# Patient Record
Sex: Male | Born: 1955 | Race: Black or African American | Hispanic: No | Marital: Married | Smoking: Never smoker
Health system: Southern US, Community
[De-identification: ages and names within clinical notes are randomized; demographics above are authoritative.]

## PROBLEM LIST (undated history)

## (undated) DIAGNOSIS — E785 Hyperlipidemia, unspecified: Secondary | ICD-10-CM

## (undated) DIAGNOSIS — I10 Essential (primary) hypertension: Secondary | ICD-10-CM

## (undated) DIAGNOSIS — E119 Type 2 diabetes mellitus without complications: Secondary | ICD-10-CM

## (undated) DIAGNOSIS — R7303 Prediabetes: Secondary | ICD-10-CM

## (undated) DIAGNOSIS — C61 Malignant neoplasm of prostate: Secondary | ICD-10-CM

## (undated) DIAGNOSIS — N529 Male erectile dysfunction, unspecified: Secondary | ICD-10-CM

## (undated) HISTORY — DX: Male erectile dysfunction, unspecified: N52.9

## (undated) HISTORY — PX: APPENDECTOMY: SHX54

## (undated) HISTORY — DX: Malignant neoplasm of prostate: C61

## (undated) HISTORY — PX: FRACTURE SURGERY: SHX138

---

## 2006-04-25 ENCOUNTER — Emergency Department: Payer: Self-pay

## 2006-04-25 ENCOUNTER — Other Ambulatory Visit: Payer: Self-pay

## 2006-10-31 ENCOUNTER — Other Ambulatory Visit: Payer: Self-pay

## 2006-10-31 ENCOUNTER — Inpatient Hospital Stay: Payer: Self-pay | Admitting: General Surgery

## 2009-06-29 HISTORY — PX: COLONOSCOPY: SHX174

## 2009-08-05 ENCOUNTER — Ambulatory Visit: Payer: Self-pay | Admitting: Gastroenterology

## 2015-11-27 ENCOUNTER — Emergency Department
Admission: EM | Admit: 2015-11-27 | Discharge: 2015-11-27 | Disposition: A | Discharge status: Home / Self Care | Payer: Federal, State, Local not specified - PPO | Attending: Emergency Medicine | Admitting: Emergency Medicine

## 2015-11-27 ENCOUNTER — Encounter: Payer: Self-pay | Admitting: Urgent Care

## 2015-11-27 DIAGNOSIS — W57XXXA Bitten or stung by nonvenomous insect and other nonvenomous arthropods, initial encounter: Secondary | ICD-10-CM | POA: Diagnosis not present

## 2015-11-27 DIAGNOSIS — S40261A Insect bite (nonvenomous) of right shoulder, initial encounter: Secondary | ICD-10-CM | POA: Insufficient documentation

## 2015-11-27 DIAGNOSIS — Y929 Unspecified place or not applicable: Secondary | ICD-10-CM | POA: Insufficient documentation

## 2015-11-27 DIAGNOSIS — S40861A Insect bite (nonvenomous) of right upper arm, initial encounter: Secondary | ICD-10-CM

## 2015-11-27 DIAGNOSIS — Y999 Unspecified external cause status: Secondary | ICD-10-CM | POA: Insufficient documentation

## 2015-11-27 DIAGNOSIS — Y939 Activity, unspecified: Secondary | ICD-10-CM | POA: Insufficient documentation

## 2015-11-27 NOTE — Discharge Instructions (Signed)
Tick Bite Information Ticks are insects that attach themselves to the skin and draw blood for food. There are various types of ticks. Common types include wood ticks and deer ticks. Most ticks live in shrubs and grassy areas. Ticks can climb onto your body when you make contact with leaves or grass where the tick is waiting. The most common places on the body for ticks to attach themselves are the scalp, neck, armpits, waist, and groin. Most tick bites are harmless, but sometimes ticks carry germs that cause diseases. These germs can be spread to a person during the tick's feeding process. The chance of a disease spreading through a tick bite depends on:   The type of tick.  Time of year.   How long the tick is attached.   Geographic location.  HOW CAN YOU PREVENT TICK BITES? Take these steps to help prevent tick bites when you are outdoors:  Wear protective clothing. Long sleeves and long pants are best.   Wear white clothes so you can see ticks more easily.  Tuck your pant legs into your socks.   If walking on a trail, stay in the middle of the trail to avoid brushing against bushes.  Avoid walking through areas with long grass.  Put insect repellent on all exposed skin and along boot tops, pant legs, and sleeve cuffs.   Check clothing, hair, and skin repeatedly and before going inside.   Brush off any ticks that are not attached.  Take a shower or bath as soon as possible after being outdoors.  WHAT IS THE PROPER WAY TO REMOVE A TICK? Ticks should be removed as soon as possible to help prevent diseases caused by tick bites. 1. If latex gloves are available, put them on before trying to remove a tick.  2. Using fine-point tweezers, grasp the tick as close to the skin as possible. You may also use curved forceps or a tick removal tool. Grasp the tick as close to its head as possible. Avoid grasping the tick on its body. 3. Pull gently with steady upward pressure until  the tick lets go. Do not twist the tick or jerk it suddenly. This may break off the tick's head or mouth parts. 4. Do not squeeze or crush the tick's body. This could force disease-carrying fluids from the tick into your body.  5. After the tick is removed, wash the bite area and your hands with soap and water or other disinfectant such as alcohol. 6. Apply a small amount of antiseptic cream or ointment to the bite site.  7. Wash and disinfect any instruments that were used.  Do not try to remove a tick by applying a hot match, petroleum jelly, or fingernail polish to the tick. These methods do not work and may increase the chances of disease being spread from the tick bite.  WHEN SHOULD YOU SEEK MEDICAL CARE? Contact your health care provider if you are unable to remove a tick from your skin or if a part of the tick breaks off and is stuck in the skin.  After a tick bite, you need to be aware of signs and symptoms that could be related to diseases spread by ticks. Contact your health care provider if you develop any of the following in the days or weeks after the tick bite:  Unexplained fever.  Rash. A circular rash that appears days or weeks after the tick bite may indicate the possibility of Lyme disease. The rash may resemble  a target with a bull's-eye and may occur at a different part of your body than the tick bite.  Redness and swelling in the area of the tick bite.   Tender, swollen lymph glands.   Diarrhea.   Weight loss.   Cough.   Fatigue.   Muscle, joint, or bone pain.   Abdominal pain.   Headache.   Lethargy or a change in your level of consciousness.  Difficulty walking or moving your legs.   Numbness in the legs.   Paralysis.  Shortness of breath.   Confusion.   Repeated vomiting.    This information is not intended to replace advice given to you by your health care provider. Make sure you discuss any questions you have with your health  care provider.   Document Released: 06/12/2000 Document Revised: 07/06/2014 Document Reviewed: 11/23/2012 Elsevier Interactive Patient Education 2016 [place] [facility] if any continued problems.

## 2015-11-27 NOTE — ED Notes (Signed)
Patient presents with tick embedded in his RIGHT posterior shoulder. Patient discovered tick just PTA when taking a shower.

## 2015-11-27 NOTE — ED Notes (Signed)
Pt has a tick on right shoulder that he noticed 45 minutes ago - Pt would like [place] to remove the tick

## 2015-11-27 NOTE — ED Notes (Signed)
Tick on right shoulder - pt noticed it 45 minutes ago and would like [place] to remove it for him

## 2015-11-27 NOTE — ED Provider Notes (Signed)
[facility] Emergency Department Provider Note  ____________________________________________  Time seen: Approximately 7:47 PM  I have reviewed the triage vital signs and the nursing notes.   HISTORY  Chief Complaint Tick Removal   HPI Edward Harmon is a 60 y.o. male is here to have a tick removed from his right shoulder that he noticed 45 minutes prior to his arrival in the emergency room.Patient denies any symptoms of headache or rash. He is uncertain when he got the tick but only noticed it today. He denies any previous tick exposures.   History reviewed. No pertinent past medical history.  There are no active problems to display for this patient.   History reviewed. No pertinent past surgical history.  No current outpatient prescriptions on file.  Allergies Review of patient's allergies indicates no known allergies.  No family history on file.  Social History Social History  Substance Use Topics  . Smoking status: Never Smoker   . Smokeless tobacco: None  . Alcohol Use: No    Review of Systems Constitutional: No fever/chills Cardiovascular: Denies chest pain. Respiratory: Denies shortness of breath. Gastrointestinal:  No nausea, no vomiting.   Musculoskeletal: Denies joint aches. Skin: Negative for rash. Neurological: Negative for headaches, focal weakness or numbness.  10-point ROS otherwise negative.  ____________________________________________   PHYSICAL EXAM:  VITAL SIGNS: ED Triage Vitals  Enc Vitals Group     BP 11/27/15 1931 170/84 mmHg     Pulse Rate 11/27/15 1931 85     Resp 11/27/15 1931 18     Temp 11/27/15 1931 98.4 F (36.9 C)     Temp Source 11/27/15 1931 Oral     SpO2 11/27/15 1931 97 %     Weight 11/27/15 1931 205 lb (92.987 kg)     Height 11/27/15 1931 5\' 9"  (1.753 m)     Head Cir --      Peak Flow --      Pain Score 11/27/15 1932 0     Pain Loc --      Pain Edu? --      Excl. in [place]? --      Constitutional: Alert and oriented. Well appearing and in no acute distress. Eyes: Conjunctivae are normal. PERRL. EOMI. Head: Atraumatic. Nose: No congestion/rhinnorhea. Neck: No stridor.   Cardiovascular: Normal rate, regular rhythm. Grossly normal heart sounds.  Good peripheral circulation. Respiratory: Normal respiratory effort.  No retractions. Lungs CTAB. Gastrointestinal: Soft and nontender. No distention.  Musculoskeletal: Moves upper and lower extremities without any difficulty. Normal gait was noted. Neurologic:  Normal speech and language. No gross focal neurologic deficits are appreciated. No gait instability. Skin:  Skin is warm, dry. There is a tick on the right posterior upper arm. No rashes were noted surrounding this area. Psychiatric: Mood and affect are normal. Speech and behavior are normal.  ____________________________________________   LABS (all labs ordered are listed, but only abnormal results are displayed)  Labs Reviewed - No data to display   PROCEDURES  Procedure(s) performed: Tick was removed using an alcohol swab and gentle traction. Patient tolerated procedure well. Tick was removed including the head without any difficulty.   Critical Care performed: No  ____________________________________________   INITIAL IMPRESSION / ASSESSMENT AND PLAN / ED COURSE  Pertinent labs & imaging results that were available during my care of the patient were reviewed by me and considered in my medical decision making (see chart for details).  Patient was advised to Circle this date on his  calendar and to watch for any signs of headache, fever, joint aches or rash. He is to follow-up with his primary care doctor if any continued problems.  ____________________________________________   FINAL CLINICAL IMPRESSION(S) / ED DIAGNOSES  Final diagnoses:  Tick bite of upper arm, right, initial encounter      NEW MEDICATIONS STARTED DURING THIS VISIT:  There  are no discharge medications for this patient.    Note:  This document was prepared using Dragon voice recognition software and may include unintentional dictation errors.    Johnn Hai, PA-C 11/27/15 2100  Daymon Larsen, MD 11/27/15 2221

## 2018-01-15 ENCOUNTER — Other Ambulatory Visit: Payer: Self-pay

## 2018-01-15 ENCOUNTER — Emergency Department
Admission: EM | Admit: 2018-01-15 | Discharge: 2018-01-15 | Disposition: A | Discharge status: Home / Self Care | Payer: Federal, State, Local not specified - PPO | Attending: Emergency Medicine | Admitting: Emergency Medicine

## 2018-01-15 ENCOUNTER — Encounter: Payer: Self-pay | Admitting: Emergency Medicine

## 2018-01-15 DIAGNOSIS — M5432 Sciatica, left side: Secondary | ICD-10-CM | POA: Insufficient documentation

## 2018-01-15 DIAGNOSIS — M545 Low back pain: Secondary | ICD-10-CM | POA: Diagnosis present

## 2018-01-15 DIAGNOSIS — I10 Essential (primary) hypertension: Secondary | ICD-10-CM | POA: Diagnosis not present

## 2018-01-15 HISTORY — DX: Essential (primary) hypertension: I10

## 2018-01-15 MED ORDER — KETOROLAC TROMETHAMINE 30 MG/ML IJ SOLN
30.0000 mg | Freq: Once | INTRAMUSCULAR | Status: AC
  Administered 2018-01-15: 30 mg via INTRAMUSCULAR
  Filled 2018-01-15: qty 1

## 2018-01-15 MED ORDER — PENTAFLUOROPROP-TETRAFLUOROETH EX AERO
INHALATION_SPRAY | CUTANEOUS | Status: AC
  Filled 2018-01-15: qty 30

## 2018-01-15 MED ORDER — ORPHENADRINE CITRATE 30 MG/ML IJ SOLN
60.0000 mg | Freq: Two times a day (BID) | INTRAMUSCULAR | Status: DC
  Administered 2018-01-15: 60 mg via INTRAMUSCULAR
  Filled 2018-01-15: qty 2

## 2018-01-15 MED ORDER — CYCLOBENZAPRINE HCL 5 MG PO TABS: ORAL_TABLET | ORAL | 0 refills | Status: DC

## 2018-01-15 MED ORDER — PENTAFLUOROPROP-TETRAFLUOROETH EX AERO
INHALATION_SPRAY | CUTANEOUS | Status: DC | PRN
  Administered 2018-01-15: 2 via TOPICAL

## 2018-01-15 MED ORDER — IBUPROFEN 800 MG PO TABS: 800.0000 mg | ORAL_TABLET | Freq: Three times a day (TID) | ORAL | 0 refills | Status: DC | PRN

## 2018-01-15 NOTE — ED Provider Notes (Signed)
[facility] Emergency Department Provider Note  ____________________________________________  Time seen: Approximately 2:11 PM  I have reviewed the triage vital signs and the nursing notes.   HISTORY  Chief Complaint Hip Pain    HPI Edward Harmon is a 62 y.o. male that presents emergency department for evaluation of left buttocks pain that radiates into left thigh for 1 day.  Patient had the same symptoms several years ago when he was diagnosed with sciatica.  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  No lower leg pain or edema. He took an aspirin for pain.  No IV drug use.  No bowel or bladder dysfunction or saddle paresthesias.  No trauma.  No fever, chills.   Past Medical History:  Diagnosis Date  . Hypertension     There are no active problems to display for this patient.   History reviewed. No pertinent surgical history.  Prior to Admission medications   Medication Sig Start Date End Date Taking? Authorizing Provider  cyclobenzaprine (FLEXERIL) 5 MG tablet Take 1-2 tablets 3 times daily as needed 01/15/18   Laban Emperor, PA-C  ibuprofen (ADVIL,MOTRIN) 800 MG tablet Take 1 tablet (800 mg total) by mouth every 8 (eight) hours as needed. 01/15/18   Laban Emperor, PA-C    Allergies Patient has no known allergies.  History reviewed. No pertinent family history.  Social History Social History   Tobacco Use  . Smoking status: Never Smoker  . Smokeless tobacco: Never Used  Substance Use Topics  . Alcohol use: No  . Drug use: Never     Review of Systems  Constitutional: No fever/chills Gastrointestinal: No abdominal pain.  No nausea, no vomiting.  Musculoskeletal: Negative for musculoskeletal pain. Skin: Negative for rash, abrasions, lacerations, ecchymosis. Neurological: Negative for headaches, numbness or tingling   ____________________________________________   PHYSICAL EXAM:  VITAL  SIGNS: ED Triage Vitals [01/15/18 1300]  Enc Vitals Group     BP (!) 188/79     Pulse Rate 87     Resp 16     Temp 98.2 F (36.8 C)     Temp Source Oral     SpO2 99 %     Weight 210 lb (95.3 kg)     Height      Head Circumference      Peak Flow      Pain Score 5     Pain Loc      Pain Edu?      Excl. in [place]?      Constitutional: Alert and oriented. Well appearing and in no acute distress. Eyes: Conjunctivae are normal. PERRL. EOMI. Head: Atraumatic. ENT:      Ears:      Nose: No congestion/rhinnorhea.      Mouth/Throat: Mucous membranes are moist.  Neck: No stridor.  Cardiovascular: Normal rate, regular rhythm.  Good peripheral circulation. Respiratory: Normal respiratory effort without tachypnea or retractions. Lungs CTAB. Good air entry to the bases with no decreased or absent breath sounds. Gastrointestinal: Bowel sounds 4 quadrants. Soft and nontender to palpation. No guarding or rigidity. No palpable masses. No distention.  Musculoskeletal: Full range of motion to all extremities. No gross deformities appreciated.  Tenderness to palpation over left SI joint.  No tenderness to palpation over lumbar spine.  No tenderness to palpation of the trochanteric bursa.  Full range of motion of hip.  Strength equal in lower extremities bilaterally.  Antalgic gait.  Neurologic:  Normal speech and language. No gross focal neurologic deficits are appreciated.  Skin:  Skin is warm, dry and intact. No rash noted. Psychiatric: Mood and affect are normal. Speech and behavior are normal. Patient exhibits appropriate insight and judgement.   ____________________________________________   LABS (all labs ordered are listed, but only abnormal results are displayed)  Labs Reviewed - No data to display ____________________________________________  EKG   ____________________________________________  RADIOLOGY   No results  found.  ____________________________________________    PROCEDURES  Procedure(s) performed:    Procedures    Medications  orphenadrine (NORFLEX) injection 60 mg (60 mg Intramuscular Given 01/15/18 1434)  pentafluoroprop-tetrafluoroeth (GEBAUERS) aerosol (2 application Topical Given 01/15/18 1433)  ketorolac (TORADOL) 30 MG/ML injection 30 mg (30 mg Intramuscular Given 01/15/18 1434)     ____________________________________________   INITIAL IMPRESSION / ASSESSMENT AND PLAN / ED COURSE  Pertinent labs & imaging results that were available during my care of the patient were reviewed by me and considered in my medical decision making (see chart for details).  Review of the [place] CSRS was performed in accordance of the [place] prior to dispensing any controlled drugs.   Patient's diagnosis is consistent with sciatica. Vital signs and exam are reassuring.  No bowel or bladder dysfunction saddle paresthesias.  No trauma.  Imaging is deferred at this time and patient is agreeable.  He was given IM Toradol and Norflex.  Patient will be discharged home with prescriptions for ibuprofen and Flexeril. Patient is to follow up with Ortho as directed. Patient is given ED precautions to return to the ED for any worsening or new symptoms.     ____________________________________________  FINAL CLINICAL IMPRESSION(S) / ED DIAGNOSES  Final diagnoses:  Sciatica of left side      NEW MEDICATIONS STARTED DURING THIS VISIT:  ED Discharge Orders        Ordered    ibuprofen (ADVIL,MOTRIN) 800 MG tablet  Every 8 hours PRN     01/15/18 1436    cyclobenzaprine (FLEXERIL) 5 MG tablet     01/15/18 1436          This chart was dictated using voice recognition software/Dragon. Despite best efforts to proofread, errors can occur which can change the meaning. Any change was purely unintentional.    Laban Emperor, PA-C 01/15/18 1604    Harmon, Edward An, MD 01/16/18 [id number]

## 2018-01-15 NOTE — ED Triage Notes (Signed)
Pt to ed with c/o left hip pain that radiates down left leg.  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

## 2019-04-11 ENCOUNTER — Encounter: Payer: Self-pay | Admitting: Urology

## 2019-04-11 ENCOUNTER — Other Ambulatory Visit
Admission: RE | Admit: 2019-04-11 | Discharge: 2019-04-11 | Disposition: A | Discharge status: Home / Self Care | Payer: Federal, State, Local not specified - PPO | Attending: Urology | Admitting: Urology

## 2019-04-11 ENCOUNTER — Ambulatory Visit (INDEPENDENT_AMBULATORY_CARE_PROVIDER_SITE_OTHER): Payer: Federal, State, Local not specified - PPO | Admitting: Urology

## 2019-04-11 ENCOUNTER — Other Ambulatory Visit: Payer: Self-pay

## 2019-04-11 VITALS — BP 145/86 | HR 95 | Ht 69.5 in | Wt 205.0 lb

## 2019-04-11 DIAGNOSIS — R972 Elevated prostate specific antigen [PSA]: Secondary | ICD-10-CM | POA: Insufficient documentation

## 2019-04-11 LAB — BLADDER SCAN AMB NON-IMAGING

## 2019-04-11 NOTE — Patient Instructions (Addendum)
Prostate Cancer Screening  The prostate is a walnut-sized gland that is located below the bladder and in front of the rectum in males. The function of the prostate (prostate gland) is to add fluid to semen during ejaculation. Prostate cancer is the second most common type of cancer in men. A screening test for cancer is a test that is done before cancer symptoms start. Screening can help to identify cancer at an early stage, when the cancer can be treated more easily. The recommended prostate cancer screening test is a blood test called the prostate-specific antigen (PSA) test. PSA is a protein that is made in the prostate. As you age, your prostate naturally produces more PSA. Abnormally high PSA levels may be caused by:  Prostate cancer.  An enlarged prostate that is not caused by cancer (benign prostatic hyperplasia, BPH). This condition is very common in older men.  A prostate gland infection (prostatitis).  Medicines to assist with hair growth, such as finasteride. Depending on the PSA results, you may need more tests, such as:  A physical exam to check the size of your prostate gland.  Blood and imaging tests.  A procedure to remove tissue samples from your prostate gland for testing (biopsy). Who should have screening? Screening recommendations vary based on age.  If you are younger than age 40, screening is not recommended.  If you are age 40-54 and you have no risk factors, screening is not recommended.  If you are younger than age 55, ask your health care provider if you need screening if you have one of these risk factors: ? Being of African-American descent. ? Having a family history of prostate cancer.  If you are age 62-69, talk with your health care provider about your need for screening and how often screening should be done.  If you are older than age 70, screening is not recommended. This is because the risks that screening can cause are greater than the benefits  that it may provide (risks outweigh the benefits). If you are at high risk for prostate cancer, your health care provider may recommend that you have screenings more often or start screening at a younger age. You may be at high risk if you:  Are older than age 55.  Are African-American.  Have a father, brother, or uncle who has been diagnosed with prostate cancer. The risk may be higher if your family member's cancer occurred at an early age. What are the benefits of screening? There is a small chance that screening may lower your risk of dying from prostate cancer. The chance is small because prostate cancer is typically a slow-growing cancer, and most men with prostate cancer die from a different cause. What are the risks of screening? The main risk of prostate cancer screening is diagnosing and treating prostate cancer that would never have caused any symptoms or problems (overdiagnosis and overtreatment). PSA screening cannot tell you if your PSA is high due to cancer or a different cause. A prostate biopsy is the only procedure to diagnose prostate cancer. Even the results of a biopsy may not tell you if your cancer needs to be treated. Slow-growing prostate cancer may not need any treatment other than monitoring, so diagnosing and treating it may cause unnecessary stress or other side effects. A prostate biopsy may also cause:  Infection or fever.  A false negative. This is a result that shows that you do not have prostate cancer when you actually do have prostate cancer. Questions   to ask your health care provider  When should I start prostate cancer screening?  What is my risk for prostate cancer?  How often do I need screening?  What type of screening tests do I need?  How do I get my test results?  What do my results mean?  Do I need treatment? Contact a health care provider if:  You have difficulty urinating.  You have pain when you urinate or ejaculate.  You have  blood in your urine or semen.  You have pain in your back or in the area of your prostate.  You have trouble getting or maintaining an erection (erectile dysfunction, ED). Summary  Prostate cancer is a common type of cancer in men. The prostate (prostate gland) is located below the bladder and in front of the rectum. This gland adds fluid to semen during ejaculation.  Prostate cancer screening may identify cancer at an early stage, when the cancer can be treated more easily.  The prostate-specific antigen (PSA) test is the recommended screening test for prostate cancer.  Discuss the risks and benefits of prostate cancer screening with your health care provider. If you are age 62 or older, screening is likely to lead to more risks than benefits (risks outweigh the benefits). This information is not intended to replace advice given to you by your health care provider. Make sure you discuss any questions you have with your health care provider. Document Released: 03/26/2017 Document Revised: 05/28/2017 Document Reviewed: 03/26/2017 Elsevier Patient Education  2020 [place].   Transrectal Ultrasound-Guided Prostate Biopsy A transrectal ultrasound-guided prostate biopsy is a procedure to remove samples of prostate tissue for testing. The procedure uses ultrasound images to guide the process of removing the samples. The samples are taken to a lab to be checked for prostate cancer. This procedure is usually done to evaluate the prostate gland of men who have raised (elevated) levels of prostate-specific antigen (PSA), which can be a sign of prostate cancer. Tell a health care provider about:  Any allergies you have.  All medicines you are taking, including vitamins, herbs, eye drops, creams, and over-the-counter medicines.  Any problems you or family members have had with anesthetic medicines.  Any blood disorders you have.  Any surgeries you have had.  Any medical conditions you  have. What are the risks? Generally, this is a safe procedure. However, problems may occur, including:  Prostate infection.  Bleeding from the rectum.  Blood in the urine.  Allergic reactions to medicines.  Damage to surrounding structures such as blood vessels, organs, or muscles.  Difficulty passing urine.  Nerve damage. This is usually temporary.  Pain. What happens before the procedure? Staying hydrated Follow instructions from your health care provider about hydration, which may include:  Up to 2 hours before the procedure - you may continue to drink clear liquids, such as water, clear fruit juice, black coffee, and plain tea.  Eating and drinking restrictions Follow instructions from your health care provider about eating and drinking, which may include:  8 hours before the procedure - stop eating heavy meals or foods such as meat, fried foods, or fatty foods.  6 hours before the procedure - stop eating light meals or foods, such as toast or cereal.  6 hours before the procedure - stop drinking milk or drinks that contain milk.  2 hours before the procedure - stop drinking clear liquids. Medicines Ask your health care provider about:  Changing or stopping your regular medicines. This is  especially important if you are taking diabetes medicines or blood thinners.  Taking over-the-counter medicines, vitamins, herbs, and supplements.  Taking medicines such as aspirin and ibuprofen. These medicines can thin your blood. Do not take these medicines unless your health care provider tells you to take them. General instructions  You may be given antibiotic medicine to help prevent infection. If so, take the antibiotic as told by your health care provider.  You will be given an enema. During an enema, a liquid is injected into your rectum to clear out waste.  You may have a blood or urine sample taken.  Plan to have someone take you home from the hospital or clinic.  What happens during the procedure?   To lower your risk of infection: ? Your health care team will wash or sanitize their hands. ? Hair may be removed from the surgical area. ? Your skin will be cleaned with a germ-killing soap. ? You may be given antibiotic medicine.  An IV will be inserted into one of your veins.  You will be given one or both of the following: ? A medicine to help you relax (sedative). ? A medicine to numb the area (local anesthetic).  You will be placed on your left side, and your knees will be bent toward your chest.  A probe with lubricated gel will be placed into your rectum, and images will be taken of your prostate and surrounding structures.  Numbing medicine will be injected into your prostate.  A biopsy needle will be inserted through your rectum and guided to your prostate using the ultrasound images.  Prostate tissue samples will be removed, and the needle will then be removed.  The biopsy samples will be sent to a lab to be tested. The procedure may vary among health care providers and hospitals. What happens after the procedure?  Your blood pressure, heart rate, breathing rate, and blood oxygen level will be monitored until the medicines you were given have worn off.  You may have some discomfort in the rectal area. You will be given pain medicine as needed.  Do not drive for 24 hours if you received a sedative. Summary  A transrectal ultrasound-guided biopsy removes samples of tissue from your prostate.  This procedure is usually done to evaluate the prostate gland of men who have raised (elevated) levels of prostate-specific antigen (PSA), which can be a sign of prostate cancer.  After your procedure, you may feel some discomfort in the rectal area.  Plan to have someone take you home from the hospital or clinic. This information is not intended to replace advice given to you by your health care provider. Make sure you discuss any  questions you have with your health care provider. Document Released: 10/30/2013 Document Revised: 10/05/2018 Document Reviewed: 09/11/2016 Elsevier Patient Education  [place].     Transrectal Prostate Biopsy Patient Education and Post Procedure Instructions    -Definition A prostate biopsy is the removal of a small amount of tissue from the prostate gland. The tissue is examined to determine whether there is cancer.  -Reasons for Procedure A prostate biopsy is usually done after an abnormal finding by: Digital rectal exam Prostate specific antigen (PSA) blood test A prostate biopsy is the only way to find out if cancer cells are present.  -Possible Complications Problems from the procedure are rare, but all procedures have some risk including: Infection Bruising or lengthy bleeding from the rectum, or in urine or semen Difficulty urinating  Reactions to anesthesia Factors that may increase the risk of complications include: Smoking History of bleeding disorders or easy bruising Use of any medications, over-the-counter medications, or herbal supplements Sensitivity or allergy to latex, medications, or anesthesia.  -Prior to Procedure Talk to your doctor about your medications. Blood thinning medications including aspirin should be stopped 1 week prior to procedure. If prescribed by your cardiologist we may need approval before stopping medications. Use a Fleets enema 2 hours before the procedure. Can be purchased at your pharmacy. Antibiotics will be administered in the clinic prior to procedure.  Please make sure you eat a light meal prior to coming in for your appointment. This can help prevent lightheadedness during the procedure and upset stomach from antibiotics. Please bring someone with you to the procedure to drive you home.  -Anesthesia Transrectal biopsy: Local anesthesia-Just the area that is being operated on is numbed using an injectable anesthetic.   -Description of the Procedure Transrectal biopsy-Your doctor will insert a small ultrasound device into the rectum. This device will produce sound waves to create an image of the prostate. These images will help guide placement of the needle. Your doctor will then insert the needle through the wall of the rectum and into the prostate gland. The procedure should take approximately 15-30 minutes.  -Will It Hurt? You may have discomfort and soreness at the biopsy site. Pain and discomfort after the procedure can be managed with medications.  -Postoperative Care When you return home after the procedure, do the following to help ensure a smooth recovery: Stay hydrated. Drink plenty of fluids for the next few days. Avoid difficult physical activity the day and evening of the procedure. Keep in mind that you may see blood in your urine, stool, or semen for several days. Resume any medications that were stopped when you are advised to do so.  After the sample is taken, it will be sent to a pathologist for examination under a microscope. This doctor will analyze the sample for cancer. You will be scheduled for an appointment to discuss results. If cancer is present, your doctor will work with you to develop a treatment plan.   -Call Your Doctor or Seek Immediate Medical Attention It is important to monitor your recovery. Alert your doctor to any problems. If any of the following occur, call your doctor or go to the emergency room: Fever 100.5 or greater within 1 week post procedure go directly to ER Call the office for: Blood in the urine more than 1 week or in semen for more than 6 weeks post-biopsy Pain that you cannot control with the medications you have been given Pain, burning, urgency, or frequency of urination Cough, shortness of breath, or chest pain- if severe go to ER Heavy rectal bleeding or bleeding that lasts more than 1 week after the biopsy If you have any questions or concerns  please contact our office at ([facility] [address] [place], [place] [address] [phone]

## 2019-04-11 NOTE — Progress Notes (Signed)
   04/11/19 10:21 AM   Edward Harmon December 06, 1955 [id number]  Referring provider: Ashok Norris, MD No address on file  CC: Elevated PSA  HPI: I saw Edward Harmon in urology clinic in consultation for elevated PSA from Dr. Lucita Harmon.  He is a 63 year old African-American male with past medical history notable for hypertension, and past surgical history notable for laparoscopic appendectomy who was found to have an elevated PSA of 12.76 on 03/29/2019.  There are no prior PSA values to review.  He denies any family history of prostate or breast cancer.  He denies any smoking history.  He denies any urinary symptoms and voids with a strong stream.  He denies any history of urinary tract infections.  He is sexually active with his wife approximately 1 time a month, and reports some mild to moderate ED, however he is not interested in any medications for this.  He works as a Administrator.  He has never undergone prior prostate biopsy.  There are no aggravating or alleviating factors.  Severity is moderate.   PMH: Hypertension  Surgical History: Laparoscopic appendectomy  Allergies: No Known Allergies  Family History: Family History  Problem Relation Age of Onset  . Prostate cancer Neg Hx     Social History:  reports that he has never smoked. He has never used smokeless tobacco. He reports that he does not drink alcohol or use drugs.  ROS: Please see flowsheet from today's date for complete review of systems.  Physical Exam: BP (!) 145/86 (BP Location: Left Arm, Patient Position: Sitting, Cuff Size: Normal)   Pulse 95   Ht 5' 9.5" (1.765 m)   Wt 205 lb (93 kg)   BMI 29.84 kg/m    Constitutional:  Alert and oriented, No acute distress. Cardiovascular: No clubbing, cyanosis, or edema. Respiratory: Normal respiratory effort, no increased work of breathing. GI: Abdomen is soft, nontender, nondistended, no abdominal masses GU: No CVA tenderness DRE: 40 g, firm and smooth, no  distinct nodules or masses Lymph: No cervical or inguinal lymphadenopathy. Skin: No rashes, bruises or suspicious lesions. Neurologic: Grossly intact, no focal deficits, moving all 4 extremities. Psychiatric: Normal mood and affect.  Laboratory Data: PSA 03/29/2019: 12.76  Pertinent Imaging: None to review  Assessment & Plan:   In summary, he is a healthy 64 year old male with no family history of prostate cancer, and elevated PSA of 12.76 on routine screening.  We reviewed the implications of an elevated PSA and the uncertainty surrounding it. In general, a man's PSA increases with age and is produced by both normal and cancerous prostate tissue. The differential diagnosis for elevated PSA includes BPH, prostate cancer, infection, recent intercourse/ejaculation, recent urethroscopic manipulation (foley placement/cystoscopy) or trauma, and prostatitis.   Management of an elevated PSA can include observation or prostate biopsy and we discussed this in detail. Our goal is to detect clinically significant prostate cancers, and manage with either active surveillance, surgery, or radiation for localized disease. Risks of prostate biopsy include bleeding, infection (including life threatening sepsis), pain, and lower urinary symptoms. Hematuria, hematospermia, and blood in the stool are all common after biopsy and can persist up to 4 weeks.   Repeat PSA with free/total ratio today, call with results Proceed with prostate biopsy if PSA remains elevated  Billey Co, MD  [facility] [address] [place], [place] [address] [phone]

## 2019-04-12 ENCOUNTER — Telehealth: Payer: Self-pay

## 2019-04-12 ENCOUNTER — Other Ambulatory Visit: Payer: Self-pay | Admitting: Urology

## 2019-04-12 LAB — PSA, TOTAL AND FREE
PSA, Free Pct: 11.4 %
PSA, Free: 1.54 ng/mL
Prostate Specific Ag, Serum: 13.5 ng/mL — ABNORMAL HIGH (ref 0.0–4.0)

## 2019-04-12 MED ORDER — DIAZEPAM 5 MG PO TABS: 5.0000 mg | ORAL_TABLET | Freq: Once | ORAL | 0 refills | Status: DC | PRN

## 2019-04-12 NOTE — Telephone Encounter (Signed)
Left patient a message to call, biopsy was scheduled for 10-19@11 :30

## 2019-04-12 NOTE — Telephone Encounter (Signed)
-----   Message from Billey Co, MD sent at 04/12/2019  8:45 AM EDT ----- PSA remains elevated at 13.5 from 12.8, needs biopsy ASAP, please review instructions with him again, I will send a valium to his pharmacy. thanks  Nickolas Madrid, MD 04/12/2019

## 2019-04-13 NOTE — Telephone Encounter (Signed)
Left pt mess to call 

## 2019-04-14 NOTE — Telephone Encounter (Signed)
Patient called back went over instructions  Made results app

## 2019-04-17 ENCOUNTER — Other Ambulatory Visit: Payer: Self-pay | Admitting: Urology

## 2019-04-17 ENCOUNTER — Other Ambulatory Visit: Payer: Self-pay

## 2019-04-17 ENCOUNTER — Encounter: Payer: Self-pay | Admitting: Urology

## 2019-04-17 ENCOUNTER — Ambulatory Visit: Payer: Federal, State, Local not specified - PPO | Admitting: Urology

## 2019-04-17 VITALS — BP 148/86 | HR 100 | Ht 69.0 in | Wt 205.0 lb

## 2019-04-17 DIAGNOSIS — R972 Elevated prostate specific antigen [PSA]: Secondary | ICD-10-CM

## 2019-04-17 MED ORDER — GENTAMICIN SULFATE 40 MG/ML IJ SOLN
40.0000 mg | Freq: Once | INTRAMUSCULAR | Status: AC
Start: 2019-04-17 — End: 2019-04-17
  Administered 2019-04-17: 40 mg via INTRAMUSCULAR

## 2019-04-17 MED ORDER — LEVOFLOXACIN 500 MG PO TABS
500.0000 mg | ORAL_TABLET | Freq: Once | ORAL | Status: AC
  Administered 2019-04-17: 500 mg via ORAL

## 2019-04-17 NOTE — Progress Notes (Signed)
   04/17/19  Indication: Elevated PSA  Prostate Biopsy Procedure   Informed consent was obtained, and we discussed the risks of bleeding and infection/sepsis. A time out was performed to ensure correct patient identity.  Pre-Procedure: - Last PSA Level: 13.5, 11.4% free - Gentamicin and levaquin given for antibiotic prophylaxis - Transrectal Ultrasound performed revealing a 48 gm prostate, PSA density 0.28 - No significant hypoechoic or median lobe noted  Procedure: - Prostate block performed using 10 cc 1% lidocaine and biopsies taken from sextant areas, a total of 12 under ultrasound guidance.  Post-Procedure: - Patient tolerated the procedure well - He was counseled to seek immediate medical attention if experiences significant bleeding, fevers, or severe pain - Return in one week to discuss biopsy results  Assessment/ Plan: Will follow up in 1-2 weeks to discuss pathology  Nickolas Madrid, MD 04/17/2019

## 2019-04-17 NOTE — Patient Instructions (Signed)
Transrectal Ultrasound-Guided Prostate Biopsy, Care After °This sheet gives you information about how to care for yourself after your procedure. Your doctor may also give you more specific instructions. If you have problems or questions, contact your doctor. °What can I expect after the procedure? °After the procedure, it is common to have: °· Pain and discomfort in your butt, especially while sitting. °· Pink-colored pee (urine), due to small amounts of blood in the pee. °· Burning while peeing (urinating). °· Blood in your poop (stool). °· Bleeding from your butt. °· Blood in your semen. °Follow these instructions at home: °Medicines °· Take over-the-counter and prescription medicines only as told by your doctor. °· If you were prescribed antibiotic medicine, take it as told by your doctor. Do not stop taking the antibiotic even if you start to feel better. °Activity ° °· Do not drive for 24 hours if you were given a medicine to help you relax (sedative) during your procedure. °· Return to your normal activities as told by your doctor. Ask your doctor what activities are safe for you. °· Ask your doctor when it is okay for you to have sex. °· Do not lift anything that is heavier than 10 lb (4.5 kg), or the limit that you are told, until your doctor says that it is safe. °General instructions ° °· Drink enough water to keep your pee pale yellow. °· Watch your pee, poop, and semen for new bleeding or bleeding that gets worse. °· Keep all follow-up visits as told by your doctor. This is important. °Contact a doctor if you: °· Have blood clots in your pee or poop. °· Notice that your pee smells bad or unusual. °· Have very bad belly pain. °· Have trouble peeing. °· Notice that your lower belly feels firm. °· Have blood in your pee for more than 2 weeks after the procedure. °· Have blood in your semen for more than 2 months after the procedure. °· Have problems getting an erection. °· Feel sick to your stomach  (nauseous). °· Throw up (vomit). °· Have new or worse bleeding in your pee, poop, or semen. °Get help right away if you: °· Have a fever or chills. °· Have bright red pee. °· Have very bad pain that does not get better with medicine. °· Cannot pee. °Summary °· After this procedure, it is common to have pain and discomfort around your butt, especially while sitting. °· You may have blood in your pee and poop. °· It is common to have blood in your semen for 1-2 months. °· If you were prescribed antibiotic medicine, take it as told by your doctor. Do not stop taking the antibiotic even if you start to feel better. °· Get help right away if you have a fever or chills. °This information is not intended to replace advice given to you by your health care provider. Make sure you discuss any questions you have with your health care provider. °Document Released: 04/13/2017 Document Revised: 10/05/2018 Document Reviewed: 04/13/2017 °Elsevier Patient Education © 2020 [employer] Inc[employer] ° °

## 2019-04-25 LAB — ANATOMIC PATHOLOGY REPORT: PDF Image: 0

## 2019-04-26 DIAGNOSIS — E782 Mixed hyperlipidemia: Secondary | ICD-10-CM | POA: Insufficient documentation

## 2019-04-26 DIAGNOSIS — R972 Elevated prostate specific antigen [PSA]: Secondary | ICD-10-CM | POA: Insufficient documentation

## 2019-04-26 DIAGNOSIS — I10 Essential (primary) hypertension: Secondary | ICD-10-CM | POA: Insufficient documentation

## 2019-04-26 DIAGNOSIS — R7303 Prediabetes: Secondary | ICD-10-CM | POA: Insufficient documentation

## 2019-05-03 ENCOUNTER — Telehealth: Payer: Self-pay | Admitting: Urology

## 2019-05-03 NOTE — Telephone Encounter (Signed)
Patient need to been seen in office for this .

## 2019-05-03 NOTE — Telephone Encounter (Signed)
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  He wants to know if someone could call him to go over results.

## 2019-05-09 ENCOUNTER — Ambulatory Visit: Payer: Federal, State, Local not specified - PPO | Admitting: Urology

## 2019-05-10 ENCOUNTER — Ambulatory Visit (INDEPENDENT_AMBULATORY_CARE_PROVIDER_SITE_OTHER): Payer: Federal, State, Local not specified - PPO | Admitting: Urology

## 2019-05-10 ENCOUNTER — Encounter: Payer: Self-pay | Admitting: Urology

## 2019-05-10 ENCOUNTER — Ambulatory Visit: Payer: Federal, State, Local not specified - PPO | Admitting: Urology

## 2019-05-10 ENCOUNTER — Other Ambulatory Visit: Payer: Self-pay

## 2019-05-10 VITALS — BP 164/89 | HR 105 | Ht 69.5 in | Wt 205.0 lb

## 2019-05-10 DIAGNOSIS — C61 Malignant neoplasm of prostate: Secondary | ICD-10-CM | POA: Diagnosis not present

## 2019-05-10 DIAGNOSIS — D4959 Neoplasm of unspecified behavior of other genitourinary organ: Secondary | ICD-10-CM

## 2019-05-10 NOTE — Progress Notes (Signed)
   05/10/2019 3:02 PM   Edward Harmon 06-11-1956 [id number]  Reason for visit: Discuss prostate biopsy results  HPI: I saw Edward Harmon back in urology clinic to discuss his prostate biopsy results.  He is a healthy 63 year old African-American male who was found to have an elevated PSA of 13.5 and underwent a prostate biopsy that showed a 48 g prostate with PSA density of 0.28 and no significant hypoechoic or median lobe.  He denies any family history of prostate cancer or breast cancer.  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   We had a long conversation about his biopsy findings.  We discussed the risk categories including very low, low, intermediate, and high risk.  We reviewed the role of the PSA and the Gleason score in determining the risk category.  Though his PSA is moderately elevated at 13.5, his biopsy results are very reassuring.  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  We discussed the random nature of prostate biopsies and the possibility for more aggressive disease in the prostate that was not biopsied, with possible upstaging in the future based on MRI or repeat prostate biopsy.  With his moderately elevated PSA greater than 10 but extremely low risk disease in a very small portion of the biopsy I recommended a prostate MRI and repeat PSA in 6 months.  We discussed the very low, but not 0, risk of developing metastatic disease while on surveillance.  -Repeat PSA and prostate MRI in 6 months -Pursue MRI fusion biopsy if any suspicious lesions on MRI -If MRI normal, pursue active surveillance with close PSA monitoring and repeat transrectal ultrasound prostate biopsy within 1 year  A total of 25 minutes were spent face-to-face with the patient, greater than 50% was spent in patient education, counseling, and coordination of care regarding new diagnosis of prostate cancer and need for  prostate MRI.  Billey Co, [facility] [address], [place], [place] [address] [phone]

## 2019-10-25 DIAGNOSIS — C61 Malignant neoplasm of prostate: Secondary | ICD-10-CM | POA: Insufficient documentation

## 2019-11-02 ENCOUNTER — Other Ambulatory Visit: Payer: Federal, State, Local not specified - PPO

## 2019-11-09 ENCOUNTER — Ambulatory Visit: Payer: Federal, State, Local not specified - PPO | Admitting: Urology

## 2019-11-20 ENCOUNTER — Other Ambulatory Visit: Payer: Federal, State, Local not specified - PPO

## 2019-12-05 ENCOUNTER — Ambulatory Visit
Admission: RE | Admit: 2019-12-05 | Discharge: 2019-12-05 | Disposition: A | Discharge status: Home / Self Care | Payer: Federal, State, Local not specified - PPO | Source: Ambulatory Visit | Attending: Urology | Admitting: Urology

## 2019-12-05 ENCOUNTER — Other Ambulatory Visit: Payer: Self-pay

## 2019-12-05 DIAGNOSIS — D4959 Neoplasm of unspecified behavior of other genitourinary organ: Secondary | ICD-10-CM | POA: Insufficient documentation

## 2019-12-05 MED ORDER — GADOBUTROL 1 MMOL/ML IV SOLN
9.0000 mL | Freq: Once | INTRAVENOUS | Status: AC | PRN
  Administered 2019-12-05: 9 mL via INTRAVENOUS

## 2019-12-11 ENCOUNTER — Other Ambulatory Visit: Payer: Federal, State, Local not specified - PPO

## 2019-12-11 ENCOUNTER — Other Ambulatory Visit: Payer: Self-pay

## 2019-12-11 DIAGNOSIS — D4959 Neoplasm of unspecified behavior of other genitourinary organ: Secondary | ICD-10-CM

## 2019-12-12 LAB — PSA: Prostate Specific Ag, Serum: 12.9 ng/mL — ABNORMAL HIGH (ref 0.0–4.0)

## 2019-12-14 ENCOUNTER — Encounter: Payer: Self-pay | Admitting: Urology

## 2019-12-14 ENCOUNTER — Other Ambulatory Visit: Payer: Self-pay

## 2019-12-14 ENCOUNTER — Ambulatory Visit (INDEPENDENT_AMBULATORY_CARE_PROVIDER_SITE_OTHER): Payer: Federal, State, Local not specified - PPO | Admitting: Urology

## 2019-12-14 VITALS — BP 194/97 | HR 94 | Ht 69.0 in | Wt 216.0 lb

## 2019-12-14 DIAGNOSIS — N529 Male erectile dysfunction, unspecified: Secondary | ICD-10-CM | POA: Diagnosis not present

## 2019-12-14 DIAGNOSIS — C61 Malignant neoplasm of prostate: Secondary | ICD-10-CM

## 2019-12-14 MED ORDER — TADALAFIL 5 MG PO TABS: 5.0000 mg | ORAL_TABLET | Freq: Every day | ORAL | 6 refills | Status: DC | PRN

## 2019-12-14 NOTE — Progress Notes (Signed)
   12/14/2019 3:37 PM   [name] [birth year] [id number]  Reason for visit: Follow up prostate cancer, erectile dysfunction  HPI: I saw Mr. Edward Harmon and his wife in urology clinic today for follow-up of prostate cancer and erectile dysfunction.  He is a 64 year old male who presented with an elevated PSA of 13.5 and underwent a prostate biopsy on 04/17/2019 that showed a 48 g prostate with a PSA density of 0.28, and 1/12 cores positive for Gleason score 3+3=6 prostate cancer with only 2% of the core involved.  After long discussion, we had opted for a prostate MRI and repeat PSA in 6 months with the discord between his PSA and low volume low risk disease.  He reports worsening erectile dysfunction since his biopsy is now interested in trying medications for this.  Prostate MRI was reviewed with the patient and his wife today.  There is a small PI-RADS four lesion at the lateral right mid apical gland, which correlates with the location of his single core of 3+3=6 disease.  I recommended pursuing a fusion biopsy in [place] within the next few months for a confirmatory biopsy prior to continuing active surveillance.  We discussed the risks and benefits at length and he is in agreement.  We also discussed the risks and benefits of Cialis at length, and he is interested in a trial of this medication.  MRI fusion confirmatory biopsy in [place] in the next 3-6 months, follow-up to discuss results Trial of Cialis for ED 5 to 10 mg on demand  Billey Co, MD  [facility] [address] [place], [place] [address] [phone]

## 2019-12-14 NOTE — Patient Instructions (Signed)
Tadalafil tablets (Cialis) What is this medicine? TADALAFIL (tah DA la fil) is used to treat erection problems in men. It is also used for enlargement of the prostate gland in men, a condition called benign prostatic hyperplasia or BPH. This medicine improves urine flow and reduces BPH symptoms. This medicine can also treat both erection problems and BPH when they occur together. This medicine may be used for other purposes; ask your health care provider or pharmacist if you have questions. COMMON BRAND NAME(S): Adcirca, ALYQ, Cialis What should I tell my health care provider before I take this medicine? They need to know if you have any of these conditions:  bleeding disorders  eye or vision problems, including a rare inherited eye disease called retinitis pigmentosa  anatomical deformation of the penis, Peyronie's disease, or history of priapism (painful and prolonged erection)  heart disease, angina, a history of heart attack, irregular heart beats, or other heart problems  high or low blood pressure  history of blood diseases, like sickle cell anemia or leukemia  history of stomach bleeding  kidney disease  liver disease  stroke  an unusual or allergic reaction to tadalafil, other medicines, foods, dyes, or preservatives  pregnant or trying to get pregnant  breast-feeding How should I use this medicine? Take this medicine by mouth with a glass of water. Follow the directions on the prescription label. You may take this medicine with or without meals. When this medicine is used for erection problems, your doctor may prescribe it to be taken once daily or as needed. If you are taking the medicine as needed, you may be able to have sexual activity 30 minutes after taking it and for up to 36 hours after taking it. Whether you are taking the medicine as needed or once daily, you should not take more than one dose per day. If you are taking this medicine for symptoms of benign  prostatic hyperplasia (BPH) or to treat both BPH and an erection problem, take the dose once daily at about the same time each day. Do not take your medicine more often than directed. Talk to your pediatrician regarding the use of this medicine in children. Special care may be needed. Overdosage: If you think you have taken too much of this medicine contact a poison control center or emergency room at once. NOTE: This medicine is only for you. Do not share this medicine with others. What if I miss a dose? If you are taking this medicine as needed for erection problems, this does not apply. If you miss a dose while taking this medicine once daily for an erection problem, benign prostatic hyperplasia, or both, take it as soon as you remember, but do not take more than one dose per day. What may interact with this medicine? Do not take this medicine with any of the following medications:  nitrates like amyl nitrite, isosorbide dinitrate, isosorbide mononitrate, nitroglycerin  other medicines for erectile dysfunction like avanafil, sildenafil, vardenafil  other tadalafil products (Adcirca)  riociguat This medicine may also interact with the following medications:  certain drugs for high blood pressure  certain drugs for the treatment of HIV infection or AIDS  certain drugs used for fungal or yeast infections, like fluconazole, itraconazole, ketoconazole, and voriconazole  certain drugs used for seizures like carbamazepine, phenytoin, and phenobarbital  grapefruit juice  macrolide antibiotics like clarithromycin, erythromycin, troleandomycin  medicines for prostate problems  rifabutin, rifampin or rifapentine This list may not describe all possible interactions. Give your   health care provider a list of all the medicines, herbs, non-prescription drugs, or dietary supplements you use. Also tell them if you smoke, drink alcohol, or use illegal drugs. Some items may interact with your  medicine. What should I watch for while using this medicine? If you notice any changes in your vision while taking this drug, call your doctor or health care professional as soon as possible. Stop using this medicine and call your health care provider right away if you have a loss of sight in one or both eyes. Contact your doctor or health care professional right away if the erection lasts longer than 4 hours or if it becomes painful. This may be a sign of serious problem and must be treated right away to prevent permanent damage. If you experience symptoms of nausea, dizziness, chest pain or arm pain upon initiation of sexual activity after taking this medicine, you should refrain from further activity and call your doctor or health care professional as soon as possible. Do not drink alcohol to excess (examples, 5 glasses of wine or 5 shots of whiskey) when taking this medicine. When taken in excess, alcohol can increase your chances of getting a headache or getting dizzy, increasing your heart rate or lowering your blood pressure. Using this medicine does not protect you or your partner against HIV infection (the virus that causes AIDS) or other sexually transmitted diseases. What side effects may I notice from receiving this medicine? Side effects that you should report to your doctor or health care professional as soon as possible:  allergic reactions like skin rash, itching or hives, swelling of the face, lips, or tongue  breathing problems  changes in hearing  changes in vision  chest pain  fast, irregular heartbeat  prolonged or painful erection  seizures Side effects that usually do not require medical attention (report to your doctor or health care professional if they continue or are bothersome):  back pain  dizziness  flushing  headache  indigestion  muscle aches  nausea  stuffy or runny nose This list may not describe all possible side effects. Call your doctor  for medical advice about side effects. You may report side effects to FDA at 1-800-FDA-1088. Where should I keep my medicine? Keep out of the reach of children. Store at room temperature between 15 and 30 degrees C (59 and 86 degrees F). Throw away any unused medicine after the expiration date. NOTE: This sheet is a summary. It may not cover all possible information. If you have questions about this medicine, talk to your doctor, pharmacist, or health care provider.  2020 Elsevier/Gold Standard (2013-11-03 13:15:49)  

## 2020-07-02 ENCOUNTER — Telehealth: Payer: Self-pay | Admitting: Urology

## 2020-07-02 NOTE — Telephone Encounter (Signed)
Per Tammy @ [facility] the patient never responded to her to schedule his Fusion BX so she has closed out the referral.   Thanks, MIchelle

## 2020-08-07 ENCOUNTER — Ambulatory Visit: Payer: Federal, State, Local not specified - PPO | Admitting: Urology

## 2020-09-02 ENCOUNTER — Other Ambulatory Visit: Payer: Federal, State, Local not specified - PPO

## 2020-11-18 ENCOUNTER — Telehealth: Payer: Self-pay

## 2020-11-18 NOTE — Telephone Encounter (Signed)
Patient called stating that the Cialis is no longer working well for him. He would like to try another medication. Patient last seen 11/2019, will he need an appointment to come in and discuss or can another script be sent in for him to try? Please advise

## 2020-11-18 NOTE — Telephone Encounter (Signed)
Needs follow up-he had a suspicious prostate MRI and I referred [facility] to [facility] for [facility] and he no showed that visit, so we have a couple things to talk about  Nickolas Madrid, MD 11/18/2020

## 2020-11-19 NOTE — Telephone Encounter (Signed)
Pt scheduled in [place] on 5/31 @9 :30 w/[place]

## 2020-11-19 NOTE — Telephone Encounter (Signed)
Left pt mess to call 

## 2020-11-26 ENCOUNTER — Encounter: Payer: Self-pay | Admitting: Urology

## 2020-11-26 ENCOUNTER — Ambulatory Visit: Payer: Federal, State, Local not specified - PPO | Admitting: Urology

## 2020-11-26 ENCOUNTER — Other Ambulatory Visit: Payer: Self-pay

## 2020-11-26 ENCOUNTER — Other Ambulatory Visit
Admission: RE | Admit: 2020-11-26 | Discharge: 2020-11-26 | Disposition: A | Discharge status: Home / Self Care | Payer: Federal, State, Local not specified - PPO | Attending: Urology | Admitting: Urology

## 2020-11-26 VITALS — BP 158/82 | HR 90 | Ht 69.0 in | Wt 217.0 lb

## 2020-11-26 DIAGNOSIS — N529 Male erectile dysfunction, unspecified: Secondary | ICD-10-CM

## 2020-11-26 DIAGNOSIS — C61 Malignant neoplasm of prostate: Secondary | ICD-10-CM | POA: Insufficient documentation

## 2020-11-26 LAB — PSA: Prostatic Specific Antigen: 15.42 ng/mL — ABNORMAL HIGH (ref 0.00–4.00)

## 2020-11-26 MED ORDER — TADALAFIL 10 MG PO TABS: 10.0000 mg | ORAL_TABLET | ORAL | 5 refills | Status: DC

## 2020-11-26 NOTE — Progress Notes (Signed)
   11/26/2020 9:41 AM   Annye English [birth year] [id number]  Reason for visit: Follow up prostate cancer, ED  HPI: I saw Mr. Corker in urology clinic today for follow-up of prostate cancer and erectile dysfunction.  He is a 65 year old male who presented with an elevated PSA of 13.5 and underwent a prostate biopsy on 04/17/2019 that showed a 48 g prostate with a PSA density of 0.28, and 1/12 cores positive for Gleason score 3+3=6 prostate cancer with only 2% of the core involved.  After long discussion, we had opted for a prostate MRI and repeat PSA in 6 months with the discord between his PSA and low volume low risk disease.  The MRI was performed 12/06/2019 and showed a small PI-RADS 4 lesion at the lateral right mid apical gland which correlated with the location of his single core of prostate cancer on prior biopsy.  I recommended a fusion biopsy in [place], but he no showed that visit multiple times.  In terms of his prostate cancer, I recommended repeating a PSA today, and if this remains significantly elevated >10 I do think it is necessary to proceed with MRI fusion biopsy as previously discussed multiple times to rule out more aggressive prostate cancer that would warrant definitive management with surgery or radiation.  He is in agreement with this plan.  He also has erectile dysfunction and has been on Cialis 5 mg as needed.  He has noticed that this is not as effective as it used to be.  He is wondering if he can increase the dose.  I recommended increasing the Cialis to 10 mg every other day, we discussed 20 mg is the max dose.  Risks, benefits, and side effects of Cialis discussed extensively.  PSA today, call with results, if > 10 recommend MRI fusion biopsy in [place] Cialis increased to 10 mg every other day   Billey Co, MD  [facility] [address], [place], [place] [address] [phone]

## 2020-11-26 NOTE — Patient Instructions (Signed)
Take the Cialis 10 mg every other day.  If you are still having trouble with erections this can be increased to 20 mg every other day.  If this is working well, you can just take the Cialis as needed 2 hours before sexual activity.  We will recheck your PSA blood test today to monitor your low risk prostate cancer.  If this remains significantly elevated though we likely need to repeat a biopsy in [place] based on the prior MRI results.  We will call you with these PSA results and the plan for the next steps

## 2020-11-27 ENCOUNTER — Telehealth: Payer: Self-pay

## 2020-11-27 NOTE — Telephone Encounter (Signed)
-----   Message from Billey Co, MD sent at 11/27/2020 10:24 AM EDT ----- PSA continues to rise to 15.  He has known prostate cancer.  He needs to follow-up in [facility] for a an MRI fusion biopsy as we discussed in clinic.  This was previously ordered, but he no showed the visit.  We discussed at length in clinic previously, and he is amenable to the fusion biopsy if the PSA continues to increase.  Sharyn Lull, can you please coordinate referral to [facility] for [facility] fusion [facility].  MRI has already been done  Nickolas Madrid, MD 11/27/2020

## 2020-11-27 NOTE — Telephone Encounter (Signed)
Called pt and emergency contact, informed them of the information below. Pt voiced understanding.

## 2020-12-19 ENCOUNTER — Other Ambulatory Visit: Payer: Self-pay | Admitting: Urology

## 2020-12-24 ENCOUNTER — Ambulatory Visit: Payer: Federal, State, Local not specified - PPO | Admitting: Urology

## 2020-12-24 ENCOUNTER — Other Ambulatory Visit: Payer: Self-pay

## 2020-12-24 ENCOUNTER — Encounter: Payer: Self-pay | Admitting: Urology

## 2020-12-24 VITALS — BP 150/92 | HR 108 | Ht 69.5 in | Wt 218.0 lb

## 2020-12-24 DIAGNOSIS — C61 Malignant neoplasm of prostate: Secondary | ICD-10-CM | POA: Diagnosis not present

## 2020-12-24 NOTE — Patient Instructions (Signed)
Brachytherapy: An international perspective (pp. [phone]). [employer]: [employer].">  Brachytherapy for Prostate Cancer Brachytherapy for prostate cancer is a type of radiation treatment that involves placing a source of radiation inside the prostate gland. There are several types of brachytherapy: Low-dose rate (LDR) therapy. This involves temporary or permanent implants of radioactive seeds or pellets that give off a low dose of radiation. Temporary low-dose implants are left in the prostate for 1-7 days. The radioactive material is contained within a delivery tool, which may be a needle, a small, thin tube (catheter), or another type of applicator. You will need to stay in the hospital while the delivery tool and radioactive material are in place. Permanent low-dose implants are injected into the prostate. These give off radiation for up to 1 year after they are inserted. After the radiation is gone, they remain harmlessly in the body and are not removed. High-dose rate (HDR) therapy. This involves inserting a material that gives off a higher dose of radiation for only a few minutes. The radioactive material is often wires or ribbons contained within a delivery tool (needle, applicator, or catheter). The delivery tool is removed after treatment, and no radioactive material is left in the prostate. In brachytherapy, the radiation does not travel far from the prostate, so healthy tissues around the prostate receive only a small dose of radiation. This helps to protect those tissues from injury. In some cases, brachytherapymay be followed by external beam radiation. Tell a health care provider about: Any allergies you have. All medicines you are taking, including vitamins, herbs, eye drops, creams, and over-the-counter medicines. Any problems you or family members have had with anesthetic medicines. Any surgeries you have had. Any blood disorders you have. Any medical conditions you have. What are the  risks? Generally, this is a safe procedure. However, problems may occur, including: Inflammation of the rectum. Problems with getting or keeping an erection (erectile dysfunction). Trouble urinating. Damage to nearby structures or organs. Diarrhea. Bleeding. Inability to control when you urinate or have bowel movements (incontinence). What happens before the procedure? Staying hydrated Follow instructions from your health care provider about hydration, which may include: Up to 2 hours before the procedure - you may continue to drink clear liquids, such as water, clear fruit juice, black coffee, and plain tea.  Eating and drinking restrictions Follow instructions from your health care provider about eating and drinking, which may include: 8 hours before the procedure - stop eating heavy meals or foods, such as meat, fried foods, or fatty foods. 6 hours before the procedure - stop eating light meals or foods, such as toast or cereal. 6 hours before the procedure - stop drinking milk or drinks that contain milk. 2 hours before the procedure - stop drinking clear liquids. Medicines Ask your health care provider about: Changing or stopping your regular medicines. This is especially important if you are taking diabetes medicines or blood thinners. Taking medicines such as aspirin and ibuprofen. These medicines can thin your blood. Do not take these medicines unless your health care provider tells you to take them. Taking over-the-counter medicines, vitamins, herbs, and supplements. Tests You may have an exam or testing. This may include: Imaging tests, such as an ultrasound, a CT scan, or an MRI. Blood or urine tests. A test to check the electrical signals in your heart (electrocardiogram). General instructions Do not use any products that contain nicotine or tobacco for at least 4 weeks before the procedure. These products include cigarettes, e-cigarettes,  and chewing tobacco. If you need  help quitting, ask your health care provider. If you will be going home right after the procedure: Plan to have someone take you home from the hospital or clinic. Plan to have a responsible adult care for you for at least 24 hours after you leave the hospital or clinic. This is important. You may need to take medicine to clean out your bowel (bowel prep). Ask your health care provider: How your surgery site will be marked. What steps will be taken to help prevent infection. These may include: Removing hair at the surgery site. Washing skin with a germ-killing soap. Taking antibiotic medicine. What happens during the procedure? An IV will be inserted into one of your veins. You will be given one or more of the following: A medicine to help you relax (sedative). A medicine to numb the area (local anesthetic). A medicine to make you fall asleep (general anesthetic). You may have a catheter inserted to drain your bladder. Your surgeon will insert the radioactive material. The method used will vary depending on whether you are receiving temporary or permanent brachytherapy. Temporary low-dose or high-dose brachytherapy A delivery tool (needle, applicator, or catheter) will be inserted into the prostate. It will be inserted through a body cavity, such as the rectum, or through the perineum, which is the area beneath the scrotum. An X-ray, ultrasound, MRI, or CT scan will be used to guide the delivery tool toward the prostate. Radioactive seeds, pellets, wires, or ribbons will be fed through the delivery tool. If the high-dose method is used: The radioactive material will be left in for a few minutes and then removed. When the treatment is finished, the delivery tool will be removed. If the low-dose method is used: The delivery tool containing the radioactive material will stay in place for 1-7 days. You will remain in the hospital while the implant is in place. When the treatment is finished,  the radioactive material and delivery tool will be removed. Permanent low-dose brachytherapy A tube or needle will be used to inject small, radioactive seeds or pellets into your prostate. The needle or tube will be removed, leaving the seeds or pellets in the prostate. The procedure may vary among health care providers and hospitals. What happens after the procedure? Your blood pressure, heart rate, breathing rate, and blood oxygen level will be monitored until you leave the hospital or clinic. If you were given a sedative during the procedure, it can affect you for several hours. Do not drive or operate machinery until your health care provider says that it is safe. Summary Brachytherapy for prostate cancer is a type of radiation treatment that involves placing a source of radiation inside the prostate gland. There are several types of brachytherapy for prostate cancer: low-dose temporary treatment, low-dose permanent treatment, and high-dose temporary treatment. The amount of time that the source of radiation is left in your prostate will depend on the type of brachytherapy you are having. This information is not intended to replace advice given to you by your health care provider. Make sure you discuss any questions you have with your healthcare provider. Document Revised: 05/28/2020 Document Reviewed: 04/17/2019 Elsevier Patient Education  2022 [place] prostate is a male gland that helps make semen. It is located below a man's bladder, in front of the rectum. Prostate cancer is when abnormal cells grow inthis gland. What are the causes? The cause of this condition is not known. What  increases the risk? You are more likely to develop this condition if: You are 31 years of age or older. You are African American. You have a family history of prostate cancer. You have a family history of breast cancer. What are the signs or symptoms? Symptoms of this  condition include: A need to pee often. Peeing that is weak, or pee that stops and starts. Trouble starting or stopping your pee. Inability to pee. Blood in your pee or semen. Pain in the lower back, lower belly (abdomen), hips, or upper thighs. Trouble getting an erection. Trouble emptying all of your pee. How is this treated? Treatment for this condition depends on your age, your health, the kind of treatment you like, and how far the cancer has spread. Treatments include: Being watched. This is called observation. You will be tested from time to time, but you will not get treated. Tests are to make sure that the cancer is not growing. Surgery. This may be done to remove the prostate, to remove the testicles, or to freeze or kill cancer cells. Radiation. This uses a strong beam to kill cancer cells. Ultrasound energy. This uses strong sound waves to kill cancer cells. Chemotherapy. This uses medicines that stop cancer cells from increasing. This kills cancer cells and healthy cells. Targeted therapy. This kills cancer cells only. Healthy cells are not affected. Hormone treatment. This stops the body from making hormones that help the cancer cells to grow. Follow these instructions at home: Take over-the-counter and prescription medicines only as told by your doctor. Eat a healthy diet. Get plenty of sleep. Ask your doctor for help to find a support group for men with prostate cancer. If you have to go to the hospital, let your cancer doctor (oncologist) know. Treatment may affect your ability to have sex. Touch, hold, hug, and caress your partner to have intimate moments. Keep all follow-up visits as told by your doctor. This is important. Contact a doctor if: You have new or more trouble peeing. You have new or more blood in your pee. You have new or more pain in your hips, back, or chest. Get help right away if: You have weakness in your legs. You lose feeling in your legs. You  cannot control your pee or your poop (stool). You have chills or a fever. Summary The prostate is a male gland that helps make semen. Prostate cancer is when abnormal cells grow in this gland. Treatment includes doing surgery, using medicines, using very strong beams, or watching without treatment. Ask your doctor for help to find a support group for men with prostate cancer. Contact a doctor if you have problems peeing or have any new pain that you did not have before. This information is not intended to replace advice given to you by your health care provider. Make sure you discuss any questions you have with your healthcare provider. Document Revised: 07/18/2020 Document Reviewed: 05/30/2019 Elsevier Patient Education  2022 [employer].

## 2020-12-24 NOTE — Progress Notes (Signed)
12/24/2020 12:57 PM   Edward Harmon June 27, 1956 [id number]  Reason for visit: Discuss prostate biopsy results  HPI: I saw Edward Harmon and his wife in clinic today to discuss his prostate biopsy results.  Briefly is a 65 year old healthy male who presented with an elevated PSA of 13.5 and underwent a prostate biopsy in October 2020 that showed a 48 g prostate and 1/12 cores positive for grade group 1 disease with only 2% core involvement.  He opted for active surveillance with a prostate MRI secondary to the discord between the significantly elevated PSA and the low volume low risk disease.  The MRI was performed in June 2021 and showed a small PI-RADS 4 lesion at the lateral right mid apical gland.  He was referred for fusion biopsy in [place], but did not follow-up for that procedure despite multiple phone calls.  He ultimately followed up in May 2022 and PSA continued to rise to 15.4, and at that point he was amenable to a fusion biopsy.  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  In the ROI, grade Group 1 prostate cancer was found, but all other cores showed prostate cancer, primarily favorable intermediate risk with Gleason score 3+4=7, with 95% max core involvement.  There is no evidence of lymphadenopathy or extraprostatic extension on the MRI.  He has mild to moderate ED on Cialis 5 to 10 mg as needed.  He has mild urinary symptoms of frequency, but drinks juice and Gatorade during the day.  Past surgical history notable for appendectomy.  We had a lengthy conversation today about the patient's diagnosis of prostate cancer.  We reviewed the risk classifications per the AUA guidelines including very low risk, low risk, intermediate risk, and high risk disease, and the need for additional staging imaging with CT and bone scan in patients with unfavorable intermediate risk and high risk disease.  I  explained that his life expectancy, clinical stage, Gleason score, PSA, and other co-morbidities influence treatment strategies.  We discussed the roles of active surveillance, radiation therapy, surgical therapy with robotic prostatectomy, and hormone therapy with androgen deprivation.  We discussed that patients urinary symptoms also impact treatment strategy, as patients with severe lower urinary tract symptoms may have significant worsening or even develop urinary retention after undergoing radiation.  In regards to surgery, we discussed robotic prostatectomy +/- lymphadenectomy at length.  The procedure takes 3 to 4 hours, and patient's typically discharge home on post-op day #1.  A Foley catheter is left in place for 7 to 10 days to allow for healing of the vesicourethral anastomosis.  There is a small risk of bleeding, infection, damage to surrounding structures or bowel, hernia, DVT/PE, or serious cardiac or pulmonary complications.  We discussed at length post-op side effects including erectile dysfunction, and the importance of pre-operative erectile function on long-term outcomes.  Even with a nerve sparing approach, there is an approximately 25% rate of permanent erectile dysfunction.  We also discussed postop urinary incontinence at length.  We expect patients to have stress incontinence post-operatively that will improve over period of weeks to months.  Less than 10% of men will require a pad at 1 year after surgery.  Patients will need to avoid heavy lifting and strenuous activity for 3 to 4 weeks, but most men return to their baseline activity status by 6 weeks.  In summary, Edward Harmon is a 65 y.o. man with high-volume favorable  intermediate risk prostate cancer. He would like to pursue radiation, preferably brachytherapy.  He is strongly averse to robotic prostatectomy, as it sounds like he has a friend who had a fair amount of complications from surgery.  We reviewed the risks and benefits  extensively.  Referral placed to radiation oncology, and will help coordinate brachytherapy.  I spent 40 total minutes on the day of the encounter including pre-visit review of the medical record, face-to-face time with the patient, and post visit ordering of labs/imaging/tests.   Billey Co, [facility] [address] [place], [place] [address] [phone]

## 2021-01-02 ENCOUNTER — Encounter: Payer: Self-pay | Admitting: *Deleted

## 2021-01-02 ENCOUNTER — Ambulatory Visit
Admission: RE | Admit: 2021-01-02 | Discharge: 2021-01-02 | Disposition: A | Discharge status: Home / Self Care | Payer: Federal, State, Local not specified - PPO | Source: Ambulatory Visit | Attending: Radiation Oncology | Admitting: Radiation Oncology

## 2021-01-02 ENCOUNTER — Encounter (INDEPENDENT_AMBULATORY_CARE_PROVIDER_SITE_OTHER): Payer: Self-pay

## 2021-01-02 ENCOUNTER — Encounter: Payer: Self-pay | Admitting: Radiation Oncology

## 2021-01-02 VITALS — BP 160/98 | HR 91 | Temp 96.9°F | Resp 16 | Ht 69.5 in | Wt 216.0 lb

## 2021-01-02 DIAGNOSIS — C61 Malignant neoplasm of prostate: Secondary | ICD-10-CM

## 2021-01-02 NOTE — Consult Note (Signed)
NEW PATIENT EVALUATION  Name: Edward Harmon  MRN: [id number]  Date:   01/02/2021     DOB: 1956-02-23   This 65 y.o. 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  REFERRING PHYSICIAN: Billey Co, MD  CHIEF COMPLAINT:  Chief Complaint  Patient presents with   Prostate Cancer    DIAGNOSIS: There were no encounter diagnoses.   PREVIOUS INVESTIGATIONS:  MRI scan reviewed Clinical notes reviewed Pathology report reviewed  HPI: Patient is a 65 year old male who presented with an elevated PSA in the 13.5 range back in October 2020.  He had a 48 g prostate at that time with 1 of 12 cores positive for Gleason 6 adenocarcinoma.  Patient underwent active surveillance.  MRI in June 2021 showed small PI-RADS 4 lesion in the right lateral mid apical gland.  He ultimately underwent a fusion study biopsy when his PSA climbed to 15.4 back in May 2022.  At that time 9 of 12 cores were positive for mostly Gleason 7 (3+4) adenocarcinoma.  MRI scan showed multi parametric signal abnormality within the lateral right and apical gland suspicious for macroscopic disease.  No evidence of locally advanced pelvic metastatic disease or extracapsular extension was noted on MRI scan.  Patient has discussed treatment options with urology and patient favors I-125 interstitial implant.  Patient also states he would need to be put to sleep for his volume study.  He is fairly asymptomatic does have erectile dysfunction for which he takes Cialis.  He has nocturia x0.  PLANNED TREATMENT REGIMEN: I-125 interstitial implant  PAST MEDICAL HISTORY:  has a past medical history of Hypertension.    PAST SURGICAL HISTORY: History reviewed. No pertinent surgical history.  FAMILY HISTORY: family history is not on file.  SOCIAL HISTORY:  reports that he has never smoked. He has never used smokeless  tobacco. He reports that he does not drink alcohol and does not use drugs.  ALLERGIES: Patient has no known allergies.  MEDICATIONS:  Current Outpatient Medications  Medication Sig Dispense Refill   amLODipine (NORVASC) 10 MG tablet Take 10 mg by mouth daily.     atorvastatin (LIPITOR) 40 MG tablet Take 40 mg by mouth daily.     tadalafil (CIALIS) 10 MG tablet Take 1 tablet (10 mg total) by mouth every other day. 30 tablet 5   No current facility-administered medications for this encounter.    ECOG PERFORMANCE STATUS:  0 - Asymptomatic  REVIEW OF SYSTEMS: Patient denies any weight loss, fatigue, weakness, fever, chills or night sweats. Patient denies any loss of vision, blurred vision. Patient denies any ringing  of the ears or hearing loss. No irregular heartbeat. Patient denies heart murmur or history of fainting. Patient denies any chest pain or pain radiating to her upper extremities. Patient denies any shortness of breath, difficulty breathing at night, cough or hemoptysis. Patient denies any swelling in the lower legs. Patient denies any nausea vomiting, vomiting of blood, or coffee ground material in the vomitus. Patient denies any stomach pain. Patient states has had normal bowel movements no significant constipation or diarrhea. Patient denies any dysuria, hematuria or significant nocturia. Patient denies any problems walking, swelling in the joints or loss of balance. Patient denies any skin changes, loss of hair or loss of weight. Patient denies any excessive worrying or anxiety or significant depression. Patient denies any problems with insomnia. Patient denies excessive  thirst, polyuria, polydipsia. Patient denies any swollen glands, patient denies easy bruising or easy bleeding. Patient denies any recent infections, allergies or URI. Patient "s visual fields have not changed significantly in recent time.   PHYSICAL EXAM: BP (!) 160/98 (BP Location: Right Arm, Patient Position:  Sitting)   Pulse 91   Temp (!) 96.9 F (36.1 C) (Tympanic)   Resp 16   Ht 5' 9.5" (1.765 m)   Wt 216 lb (98 kg)   BMI 31.44 kg/m  Well-developed well-nourished patient in NAD. HEENT reveals PERLA, EOMI, discs not visualized.  Oral cavity is clear. No oral mucosal lesions are identified. Neck is clear without evidence of cervical or supraclavicular adenopathy. Lungs are clear to A&P. Cardiac examination is essentially unremarkable with regular rate and rhythm without murmur rub or thrill. Abdomen is benign with no organomegaly or masses noted. Motor sensory and DTR levels are equal and symmetric in the upper and lower extremities. Cranial nerves II through XII are grossly intact. Proprioception is intact. No peripheral adenopathy or edema is identified. No motor or sensory levels are noted. Crude visual fields are within normal range.  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  I would plan on doing a full implant risks and benefits of treatment including increased lower urinary tract symptoms diarrhea fatigue alteration of blood counts risks of general anesthesia as well as radiation safety precautions all were discussed with the patient and his wife.  I have asked the patient also to return to Dr. Blossom Hoops office for an Eligard 25-month depot injection.  New patient and wife both complementary and plan well.  I would like to take this opportunity to thank you for allowing me to participate in the care of your patient.Noreene Filbert, MD

## 2021-01-07 ENCOUNTER — Telehealth: Payer: Self-pay

## 2021-01-07 NOTE — Telephone Encounter (Signed)
Benefits investigation, faxed for Eligard.

## 2021-01-14 NOTE — Telephone Encounter (Signed)
Benefits form received. No PA required. Pt scheduled.

## 2021-01-23 ENCOUNTER — Ambulatory Visit: Payer: Federal, State, Local not specified - PPO | Admitting: Physician Assistant

## 2021-01-23 ENCOUNTER — Encounter: Payer: Self-pay | Admitting: Physician Assistant

## 2021-01-23 ENCOUNTER — Other Ambulatory Visit: Payer: Self-pay

## 2021-01-23 VITALS — BP 174/80 | HR 93 | Ht 69.5 in | Wt 218.0 lb

## 2021-01-23 DIAGNOSIS — C61 Malignant neoplasm of prostate: Secondary | ICD-10-CM

## 2021-01-23 MED ORDER — LEUPROLIDE ACETATE (6 MONTH) 45 MG ~~LOC~~ KIT
45.0000 mg | PACK | Freq: Once | SUBCUTANEOUS | Status: AC
Start: 2021-01-23 — End: 2021-01-23
  Administered 2021-01-23: 45 mg via SUBCUTANEOUS

## 2021-01-23 NOTE — Patient Instructions (Signed)
Please start the following supplements to reduce your risk for bone loss while on hormone suppression therapy: -Calcium 1000-'1200mg'$  daily -Vitamin D 800-1000IU daily

## 2021-01-23 NOTE — Progress Notes (Signed)
Patient presented to clinic today for initiation of 6 months of ADT with Eligard per Dr. Baruch Gouty. He is accompanied today by his wife, who contributes to HPI.  He has a history of hypertension, hyperlipidemia, and prediabetes. His wife reports he could maintain a healthier diet.  We discussed the anticipated side effects of ADT today including hot flashes, decreased libido, erectile dysfunction, breast tenderness or size changes, fatigue, weight gain, bone loss, muscle mass loss, brain fog, increased cholesterol, increased blood pressure, increased blood sugar, and increased risk for stroke and heart attack.  I counseled him to keep regular follow-ups with his PCP for monitoring of his metabolic syndrome, pursue a healthy diet, and exercise regularly including weightbearing exercise to mitigate bone and muscle loss for the duration of therapy.  Additionally, I counseled him to start daily calcium 1000-'1200mg'$  and vitamin D 800-1000IU supplements to reduce bone loss.  Written resources provided today regarding health eating and ADT side effects. He expressed understanding, all questions answered. Work note also provided for today per patient request.  Debroah Loop, PA-C 01/23/21 11:13 AM  Return in about 6 months (around 07/26/2021) for Prostate cancer f/u with Dr. Diamantina Providence with PSA prior.   I spent 20 minutes on the day of the encounter to include pre-visit record review, face-to-face time with the patient, and post-visit ordering of tests.

## 2021-01-23 NOTE — Progress Notes (Signed)
Eligard SubQ Injection   Due to Prostate Cancer patient is present today for a Eligard Injection.  Medication: Eligard 6 month Dose: 45 mg  Location: left ventrogluteal Lot: [id number] Exp: 07/2022  Patient tolerated well, no complications were noted  Performed by: Bradly Bienenstock, CMA  Per Dr. Diamantina Providence patient is to continue therapy for 6 months . Patient's next follow up was scheduled for 07/30/21.  PA approval dates:  No PA required.

## 2021-02-14 ENCOUNTER — Telehealth: Payer: Self-pay

## 2021-02-14 NOTE — Telephone Encounter (Signed)
Incoming message from cancer center stating:   Patient's wife called stating that her husband is having bone pain, muscle and chest tightness and chills after getting his Eligaurd shot. Wife stated that he can't walk at times. Sxs started about 1 week after.  Contacted patient's wife to follow up on message. She states patient felt fine after injection but starting feeling tired and fatigued with muscle pain/ bone pain the week after. He now has developed chest tightening and leg swelling. She was advised for patient to contact PCP for further evaluation. These symptoms would not be related to Eligard injection. Adverse injection reactions would typically happen with in 24-48 hours post. Typical side effects of Eligard are fatigue, hot flashes, decreased sex drive, injection site irritation. Per Dr. Erlene Quan patient does not meet criteria for Flare adverse reaction.  Due to patient having increased symptoms including muscle pain, chest pain/tightness, and swelling in legs she was advised to check in with PCP for further evaluation and if symptoms worsen see urgent care or ED. Patient's wife verbalized understanding

## 2021-02-19 ENCOUNTER — Other Ambulatory Visit
Admission: RE | Admit: 2021-02-19 | Discharge: 2021-02-19 | Disposition: A | Discharge status: Home / Self Care | Payer: Federal, State, Local not specified - PPO | Source: Ambulatory Visit | Attending: Urology | Admitting: Urology

## 2021-02-19 ENCOUNTER — Other Ambulatory Visit: Payer: Self-pay

## 2021-02-19 ENCOUNTER — Other Ambulatory Visit: Payer: Self-pay | Admitting: Urology

## 2021-02-19 DIAGNOSIS — C61 Malignant neoplasm of prostate: Secondary | ICD-10-CM

## 2021-02-19 HISTORY — DX: Hyperlipidemia, unspecified: E78.5

## 2021-02-19 HISTORY — DX: Prediabetes: R73.03

## 2021-02-19 NOTE — Patient Instructions (Addendum)
Your procedure is scheduled on: September 1 for Volume Study and September 30 for Radioactive Seed Implant Report to the Registration Desk on the 1st floor of the [facility]. To find out your arrival time, please call [phone] between 1PM - 3PM on: the day prior to your procedure  Follow these instructions for both procedures.  REMEMBER: Instructions that are not followed completely may result in serious medical risk, up to and including death; or upon the discretion of your surgeon and anesthesiologist your surgery may need to be rescheduled.  Do not eat or drink after midnight the night before surgery.  No gum chewing, lozengers or hard candies.  DO NOT TAKE ANY MEDICATIONS THE MORNING OF SURGERY  One week prior to surgery: Stop Anti-inflammatories (NSAIDS) such as Advil, Aleve, Ibuprofen, Motrin, Naproxen, Naprosyn and Aspirin based products such as Excedrin, Goodys Powder, BC Powder. Stop ANY OVER THE COUNTER supplements until after surgery. Stop calcum, Vitamin D You may however, continue to take Tylenol if needed for pain up until the day of surgery.  No Alcohol for 24 hours before or after surgery.  No Smoking including e-cigarettes for 24 hours prior to surgery.  No chewable tobacco products for at least 6 hours prior to surgery.  No nicotine patches on the day of surgery.  Do not use any "recreational" drugs for at least a week prior to your surgery.  Please be advised that the combination of cocaine and anesthesia may have negative outcomes, up to and including death. If you test positive for cocaine, your surgery will be cancelled.  On the morning of surgery brush your teeth with toothpaste and water, you may rinse your mouth with mouthwash if you wish. Do not swallow any toothpaste or mouthwash.  Do not wear jewelry, make-up, hairpins, clips or nail polish.  Do not wear lotions, powders, or perfumes.   Do not shave body from the neck down 48 hours prior to  surgery just in case you cut yourself which could leave a site for infection.   Contact lenses, hearing aids and dentures may not be worn into surgery.  Do not bring valuables to the hospital. [facility] is not responsible for any missing/lost belongings or valuables.   Notify your doctor if there is any change in your medical condition (cold, fever, infection).  Wear comfortable clothing (specific to your surgery type) to the hospital.  After surgery, you can help prevent lung complications by doing breathing exercises.  Take deep breaths and cough every 1-2 hours. Your doctor may order a device called an Incentive Spirometer to help you take deep breaths.  If you are being discharged the day of surgery, you will not be allowed to drive home. You will need a responsible adult (18 years or older) to drive you home and stay with you that night.   If you are taking public transportation, you will need to have a responsible adult (18 years or older) with you. Please confirm with your physician that it is acceptable to use public transportation.   Please call the [facility] at [phone] if you have any questions about these instructions.  Surgery Visitation Policy:  Patients undergoing a surgery or procedure may have one family member or support person with them as long as that person is not COVID-19 positive or experiencing its symptoms.  That person may remain in the waiting area during the procedure.  PRIOR TO THE SEED IMPLANT SURGERY on September 30: Use the Fleets  Enema rectally per box directions on the morning of surgery prior to arriving at the hospital. Allow enough time to be able to have a bowel movement after using the enema.

## 2021-02-21 ENCOUNTER — Encounter
Admission: RE | Admit: 2021-02-21 | Discharge: 2021-02-21 | Disposition: A | Discharge status: Home / Self Care | Payer: Federal, State, Local not specified - PPO | Source: Ambulatory Visit | Attending: Urology | Admitting: Urology

## 2021-02-21 ENCOUNTER — Other Ambulatory Visit: Payer: Self-pay

## 2021-02-21 DIAGNOSIS — I10 Essential (primary) hypertension: Secondary | ICD-10-CM | POA: Diagnosis not present

## 2021-02-21 DIAGNOSIS — Z01818 Encounter for other preprocedural examination: Secondary | ICD-10-CM | POA: Diagnosis present

## 2021-02-26 MED ORDER — LACTATED RINGERS IV SOLN
INTRAVENOUS | Status: DC
Start: 2021-02-26 — End: 2021-02-27

## 2021-02-26 MED ORDER — ORAL CARE MOUTH RINSE: 15.0000 mL | Freq: Once | OROMUCOSAL | Status: AC

## 2021-02-26 MED ORDER — FAMOTIDINE 20 MG PO TABS: 20.0000 mg | ORAL_TABLET | Freq: Once | ORAL | Status: AC

## 2021-02-26 MED ORDER — CHLORHEXIDINE GLUCONATE 0.12 % MT SOLN: 15.0000 mL | Freq: Once | OROMUCOSAL | Status: AC

## 2021-02-27 ENCOUNTER — Other Ambulatory Visit: Payer: Self-pay

## 2021-02-27 ENCOUNTER — Encounter: Payer: Self-pay | Admitting: Urology

## 2021-02-27 ENCOUNTER — Ambulatory Visit: Payer: Federal, State, Local not specified - PPO | Admitting: Urgent Care

## 2021-02-27 ENCOUNTER — Encounter
Admission: RE | Disposition: A | Discharge status: Home / Self Care | Payer: Self-pay | Source: Home / Self Care | Attending: Urology

## 2021-02-27 ENCOUNTER — Ambulatory Visit
Admission: RE | Admit: 2021-02-27 | Discharge: 2021-02-27 | Disposition: A | Discharge status: Home / Self Care | Payer: Federal, State, Local not specified - PPO | Attending: Urology | Admitting: Urology

## 2021-02-27 ENCOUNTER — Ambulatory Visit: Payer: Federal, State, Local not specified - PPO

## 2021-02-27 DIAGNOSIS — I10 Essential (primary) hypertension: Secondary | ICD-10-CM | POA: Insufficient documentation

## 2021-02-27 DIAGNOSIS — C61 Malignant neoplasm of prostate: Secondary | ICD-10-CM | POA: Insufficient documentation

## 2021-02-27 DIAGNOSIS — Z9049 Acquired absence of other specified parts of digestive tract: Secondary | ICD-10-CM | POA: Diagnosis not present

## 2021-02-27 DIAGNOSIS — E785 Hyperlipidemia, unspecified: Secondary | ICD-10-CM | POA: Diagnosis not present

## 2021-02-27 DIAGNOSIS — R7303 Prediabetes: Secondary | ICD-10-CM | POA: Diagnosis not present

## 2021-02-27 HISTORY — PX: VOLUME STUDY: SHX6646

## 2021-02-27 SURGERY — ULTRASOUND, PROSTATE, FOR VOLUME DETERMINATION
Anesthesia: General

## 2021-02-27 MED ORDER — GLYCOPYRROLATE 0.2 MG/ML IJ SOLN
INTRAMUSCULAR | Status: AC
  Filled 2021-02-27: qty 1

## 2021-02-27 MED ORDER — FAMOTIDINE 20 MG PO TABS
ORAL_TABLET | ORAL | Status: AC
  Administered 2021-02-27: 20 mg via ORAL
  Filled 2021-02-27: qty 1

## 2021-02-27 MED ORDER — GLYCOPYRROLATE 0.2 MG/ML IJ SOLN
INTRAMUSCULAR | Status: DC | PRN
  Administered 2021-02-27: .2 mg via INTRAVENOUS

## 2021-02-27 MED ORDER — MIDAZOLAM HCL 2 MG/2ML IJ SOLN
INTRAMUSCULAR | Status: DC | PRN
  Administered 2021-02-27: 2 mg via INTRAVENOUS

## 2021-02-27 MED ORDER — FENTANYL CITRATE (PF) 100 MCG/2ML IJ SOLN
INTRAMUSCULAR | Status: AC
  Filled 2021-02-27: qty 2

## 2021-02-27 MED ORDER — LIDOCAINE HCL (CARDIAC) PF 100 MG/5ML IV SOSY
PREFILLED_SYRINGE | INTRAVENOUS | Status: DC | PRN
  Administered 2021-02-27: 100 mg via INTRAVENOUS

## 2021-02-27 MED ORDER — CHLORHEXIDINE GLUCONATE 0.12 % MT SOLN
OROMUCOSAL | Status: AC
  Administered 2021-02-27: 15 mL via OROMUCOSAL
  Filled 2021-02-27: qty 15

## 2021-02-27 MED ORDER — DEXMEDETOMIDINE (PRECEDEX) IN NS 20 MCG/5ML (4 MCG/ML) IV SYRINGE
PREFILLED_SYRINGE | INTRAVENOUS | Status: DC | PRN
  Administered 2021-02-27: 12 ug via INTRAVENOUS

## 2021-02-27 MED ORDER — PROPOFOL 10 MG/ML IV BOLUS
INTRAVENOUS | Status: DC | PRN
  Administered 2021-02-27: 10 mg via INTRAVENOUS
  Administered 2021-02-27: 50 mg via INTRAVENOUS

## 2021-02-27 MED ORDER — ONDANSETRON HCL 4 MG/2ML IJ SOLN: 4.0000 mg | Freq: Once | INTRAMUSCULAR | Status: DC | PRN

## 2021-02-27 MED ORDER — PROPOFOL 500 MG/50ML IV EMUL
INTRAVENOUS | Status: AC
  Filled 2021-02-27: qty 50

## 2021-02-27 MED ORDER — ONDANSETRON HCL 4 MG/2ML IJ SOLN
INTRAMUSCULAR | Status: AC
  Filled 2021-02-27: qty 2

## 2021-02-27 MED ORDER — MIDAZOLAM HCL 2 MG/2ML IJ SOLN
INTRAMUSCULAR | Status: AC
  Filled 2021-02-27: qty 2

## 2021-02-27 MED ORDER — PROPOFOL 500 MG/50ML IV EMUL
INTRAVENOUS | Status: DC | PRN
  Administered 2021-02-27: 50 ug/kg/min via INTRAVENOUS

## 2021-02-27 MED ORDER — FENTANYL CITRATE (PF) 100 MCG/2ML IJ SOLN
INTRAMUSCULAR | Status: DC | PRN
  Administered 2021-02-27: 50 ug via INTRAVENOUS

## 2021-02-27 MED ORDER — PHENYLEPHRINE HCL (PRESSORS) 10 MG/ML IV SOLN
INTRAVENOUS | Status: AC
  Filled 2021-02-27: qty 1

## 2021-02-27 MED ORDER — DEXAMETHASONE SODIUM PHOSPHATE 10 MG/ML IJ SOLN
INTRAMUSCULAR | Status: AC
  Filled 2021-02-27: qty 1

## 2021-02-27 SURGICAL SUPPLY — 1 items: TRAY FOLEY MTR SLVR 16FR STAT (SET/KITS/TRAYS/PACK) ×2 IMPLANT

## 2021-02-27 NOTE — Discharge Instructions (Signed)
AMBULATORY SURGERY  ?DISCHARGE INSTRUCTIONS ? ? ?The drugs that you were given will stay in your system until tomorrow so for the next 24 hours you should not: ? ?Drive an automobile ?Make any legal decisions ?Drink any alcoholic beverage ? ? ?You may resume regular meals tomorrow.  Today it is better to start with liquids and gradually work up to solid foods. ? ?You may eat anything you prefer, but it is better to start with liquids, then soup and crackers, and gradually work up to solid foods. ? ? ?Please notify your doctor immediately if you have any unusual bleeding, trouble breathing, redness and pain at the surgery site, drainage, fever, or pain not relieved by medication. ? ? ? ?Additional Instructions: ? ? ? ?Please contact your physician with any problems or Same Day Surgery at [phone], Monday through Friday 6 am to 4 pm, or [facility] number at [phone].  ?

## 2021-02-27 NOTE — Anesthesia Postprocedure Evaluation (Signed)
Anesthesia Post Note  Patient: Edward Harmon  Procedure(s) Performed: VOLUME STUDY  Patient location during evaluation: PACU Anesthesia Type: General Level of consciousness: awake and alert Pain management: pain level controlled Vital Signs Assessment: post-procedure vital signs reviewed and stable Respiratory status: spontaneous breathing, nonlabored ventilation, respiratory function stable and patient connected to nasal cannula oxygen Cardiovascular status: blood pressure returned to baseline and stable Postop Assessment: no apparent nausea or vomiting Anesthetic complications: no   No notable events documented.   Last Vitals:  Vitals:   02/27/21 0830 02/27/21 0841  BP: 113/84 138/86  Pulse: 79 83  Resp: 12 20  Temp: (!) 36.4 C (!) 36.4 C  SpO2: 99% 98%    Last Pain:  Vitals:   02/27/21 0841  TempSrc: Temporal  PainSc: 0-No pain                 Arita Miss

## 2021-02-27 NOTE — Transfer of Care (Signed)
Immediate Anesthesia Transfer of Care Note  Patient: Edward Harmon  Procedure(s) Performed: Procedure(s): VOLUME STUDY (N/A)  Patient Location: PACU  Anesthesia Type:General  Level of Consciousness: arousable  Airway & Oxygen Therapy: Patient Spontanous Breathing and Patient connected to face mask oxygen  Post-op Assessment: Report given to RN and Post -op Vital signs reviewed and stable  Post vital signs: Reviewed and stable  Last Vitals:  Vitals:   02/27/21 0610 02/27/21 0815  BP: (!) 141/83 (!) 107/54  Pulse: 81 78  Resp: 16 12  Temp: 36.7 C (!) 36.3 C  SpO2: [id number]    Complications: No apparent anesthesia complications

## 2021-02-27 NOTE — H&P (Signed)
   02/27/21 7:06 AM   Annye English 02/16/1956 [id number]  CC: Prostate cancer  HPI: 65 year old male with intermediate risk prostate cancer who is opted for brachytherapy.  Here today for volume study.   PMH: Past Medical History:  Diagnosis Date   Hyperlipidemia    Hypertension    Pre-diabetes    Prostate cancer [facility])     Surgical History: Past Surgical History:  Procedure Laterality Date   APPENDECTOMY     COLONOSCOPY  2011   FRACTURE SURGERY Right    hand      Family History: Family History  Problem Relation Age of Onset   Prostate cancer Neg Hx     Social History:  reports that he has never smoked. He has never used smokeless tobacco. He reports that he does not drink alcohol and does not use drugs.  Physical Exam: BP (!) 141/83   Pulse 81   Temp 98 F (36.7 C) (Oral)   Resp 16   Ht '5\' 9"'$  (1.753 m)   Wt 102.1 kg   SpO2 100%   BMI 33.23 kg/m    Constitutional:  Alert and oriented, No acute distress. Cardiovascular: Regular rate and rhythm Respiratory: Clear to auscultation bilaterally GI: Abdomen is soft, nontender, nondistended, no abdominal masses   Laboratory Data: Reviewed  Assessment & Plan:   65 year old male with intermediate risk prostate cancer who opted for brachytherapy with radiation oncology.  Did not tolerate volume study without sedation, and here today for volume study with sedation in anticipation of upcoming brachytherapy.  Volume study today   Nickolas Madrid, MD 02/27/2021  [facility] [address], [place], [place] [address] [phone]

## 2021-02-27 NOTE — Progress Notes (Signed)
See OP note by Dr Baruch Gouty under procedures tab  Nickolas Madrid, MD 02/27/2021

## 2021-02-27 NOTE — Anesthesia Preprocedure Evaluation (Signed)
Anesthesia Evaluation  Patient identified by MRN, date of birth, ID band Patient awake    Reviewed: Allergy & Precautions, NPO status , Patient's Chart, lab work & pertinent test results  History of Anesthesia Complications Negative for: history of anesthetic complications  Airway Mallampati: III  TM Distance: >3 FB Neck ROM: Full    Dental no notable dental hx. 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')   Pulmonary exam normal breath sounds clear to auscultation       Cardiovascular Exercise Tolerance: Good METShypertension, (-) CAD and (-) Past MI (-) dysrhythmias  Rhythm:Regular Rate:Normal - Systolic murmurs    Neuro/Psych negative neurological ROS  negative psych ROS   GI/Hepatic neg GERD  ,(+)     (-) substance abuse  ,   Endo/Other  neg diabetes  Renal/GU negative Renal ROS     Musculoskeletal   Abdominal   Peds  Hematology   Anesthesia Other Findings Past Medical History: No date: Hyperlipidemia No date: Hypertension No date: Pre-diabetes No date: Prostate cancer ([place])  Reproductive/Obstetrics                             Anesthesia Physical Anesthesia Plan  ASA: 2  Anesthesia Plan: General   Post-op Pain Management:    Induction: Intravenous  PONV Risk Score and Plan: 2 and Ondansetron, Propofol infusion and TIVA  Airway Management Planned: Nasal Cannula and Natural Airway  Additional Equipment: None  Intra-op Plan:   Post-operative Plan:   Informed Consent: I have reviewed the patients History and Physical, chart, labs and discussed the procedure including the risks, benefits and alternatives for the proposed anesthesia with the patient or authorized representative who has indicated his/her understanding and acceptance.      Dental advisory given  Plan Discussed with: CRNA and Surgeon  Anesthesia Plan Comments: (Patient couldn't tolerate this procedure awake in radiation oncology and thus anesthesia services are requested. Discussed risks of anesthesia with patient, including possibility of difficulty with spontaneous ventilation under anesthesia necessitating airway intervention, PONV, and rare risks such as cardiac or respiratory or neurological events, and allergic reactions. Patient understands.)        Anesthesia Quick Evaluation

## 2021-02-27 NOTE — Procedures (Signed)
Radiation Oncology Prostate volume study note  Name: Edward Harmon   Date:   01/03/2021 MRN:  [id number] DOB: 1956-02-10    This 65 y.o. male presents to the OR today for volume study anticipation of I-125 interstitial implant for stage IIa Gleason 7 (3+4) adenocarcinoma presenting with a PSA in the 15 range REFERRING PROVIDER: No ref. provider found  HPI: Patient is a 65 year old male.  Who presented with an elevated PSA in the 13.5 range back in October 2020.  He had 1 of 2 cores positive for Gleason 6 adenocarcinoma.  He underwent active surveillance underwent a fusion biopsy study back in May when his PSA climbed to 15.4 at that time 9 of 12 cores were positive for mostly Gleason 7 (3+4) adenocarcinoma no evidence of MRI of extracapsular extension or advanced pelvic metastatic disease was noted.  He is chosen I-125 interstitial implant.  Taken to the OR today under general sedation for volume study.  COMPLICATIONS OF TREATMENT: none  FOLLOW UP COMPLIANCE: keeps appointments   PHYSICAL EXAM:  BP 138/86   Pulse 83   Temp (!) 97.5 F (36.4 C) (Temporal)   Resp 20   Ht '5\' 9"'$  (1.753 m)   Wt 225 lb (102.1 kg)   SpO2 98%   BMI 33.23 kg/m  Well-developed well-nourished patient in NAD. HEENT reveals PERLA, EOMI, discs not visualized.  Oral cavity is clear. No oral mucosal lesions are identified. Neck is clear without evidence of cervical or supraclavicular adenopathy. Lungs are clear to A&P. Cardiac examination is essentially unremarkable with regular rate and rhythm without murmur rub or thrill. Abdomen is benign with no organomegaly or masses noted. Motor sensory and DTR levels are equal and symmetric in the upper and lower extremities. Cranial nerves II through XII are grossly intact. Proprioception is intact. No peripheral adenopathy or edema is identified. No motor or sensory levels are noted. Crude visual fields are within normal range.  RADIOLOGY RESULTS: Ultrasound used for volume  study  PLAN: Patient was taken to the cystoscopy suite in the OR. Patient was placed in the low lithotomy position. Foley catheter was placed. Trans-rectal ultrasound probe was inserted into the rectum and prostate seminal vesicles were visualized as well as bladder base. stepping images were performed on a 5 mm increments. Images will be placed in Brachy[facility]ision treatment planning system to determine seed placement coordinates for eventual I-125 interstitial implant. Images will be reviewed with the physics and dosimetry staff for final quality approval. I personally was present for the volume study and assisted in delineation of contour volumes.  At the end of the procedure Foley catheter was removed, rectal ultrasound probe was removed. Patient tolerated his procedures extremely well with no side effects or complaints. Patient has given appointment for interstitial implant date. Consent was signed today as well as history and physical performed in preparation for his outpatient surgical implant.     Edward Filbert, MD

## 2021-02-27 NOTE — Anesthesia Procedure Notes (Signed)
Date/Time: 02/27/2021 7:37 AM Performed by: Doreen Salvage, CRNA Pre-anesthesia Checklist: Patient identified, Emergency Drugs available, Suction available and Patient being monitored Patient Re-evaluated:Patient Re-evaluated prior to induction Oxygen Delivery Method: Nasal cannula Induction Type: IV induction Dental Injury: Teeth and Oropharynx as per pre-operative assessment  Comments: Nasal cannula with etCO2 monitoring

## 2021-02-28 ENCOUNTER — Encounter: Payer: Self-pay | Admitting: Urology

## 2021-02-28 ENCOUNTER — Encounter: Payer: Self-pay | Admitting: *Deleted

## 2021-02-28 ENCOUNTER — Encounter: Payer: Self-pay | Admitting: Licensed Clinical Social Worker

## 2021-02-28 ENCOUNTER — Ambulatory Visit
Admission: RE | Admit: 2021-02-28 | Discharge: 2021-02-28 | Disposition: A | Discharge status: Home / Self Care | Payer: Federal, State, Local not specified - PPO | Source: Ambulatory Visit | Attending: Radiation Oncology | Admitting: Radiation Oncology

## 2021-02-28 VITALS — BP 158/87 | HR 76 | Temp 97.2°F | Resp 16 | Wt 222.0 lb

## 2021-02-28 DIAGNOSIS — R232 Flushing: Secondary | ICD-10-CM | POA: Insufficient documentation

## 2021-02-28 DIAGNOSIS — C61 Malignant neoplasm of prostate: Secondary | ICD-10-CM

## 2021-02-28 DIAGNOSIS — E785 Hyperlipidemia, unspecified: Secondary | ICD-10-CM | POA: Insufficient documentation

## 2021-02-28 DIAGNOSIS — Z79899 Other long term (current) drug therapy: Secondary | ICD-10-CM | POA: Diagnosis not present

## 2021-02-28 DIAGNOSIS — Z923 Personal history of irradiation: Secondary | ICD-10-CM | POA: Diagnosis not present

## 2021-02-28 DIAGNOSIS — I10 Essential (primary) hypertension: Secondary | ICD-10-CM | POA: Insufficient documentation

## 2021-02-28 DIAGNOSIS — R5383 Other fatigue: Secondary | ICD-10-CM | POA: Diagnosis not present

## 2021-02-28 NOTE — H&P (Signed)
History and physical  Name: Edward Harmon  MRN: [id number]  Date:   02/28/2021     DOB: 06-30-1955   This 65 y.o. male patient presents to the clinic for i history and physical in preparation of I-125 interstitial implant for stage IIa intermediate grade Gleason 7 (3+4) adenocarcinoma presenting with a PSA in the 15 range REFERRING PHYSICIAN: Wayland Denis, PA-C  CHIEF COMPLAINT:  Chief Complaint  Patient presents with   Prostate Cancer    Post volume study    DIAGNOSIS: The encounter diagnosis was [name].   PREVIOUS INVESTIGATIONS:  Clinical notes reviewed Pathology reviewed Volume study performed  HPI: Patient is a 65 year old male who presented with an elevated PSA in the 13.5 range back in October 2020.  1 of 2 cores showed Gleason 6 adenocarcinoma.  He underwent active surveillance MRI in June 2021 showed PI-RADS 4 lesion in the right lateral mid apical gland ultimately underwent fusion study when his PSA climbed to 15.4.  9 of 12 cores were positive mostly Gleason 7 (3+4) adenocarcinoma.  There is no evidence of local advanced disease or adenopathy on MRI scan.  Patient opted for I-125 interstitial implant.  Had a volume study performed yesterday.  Patient is having no significant lower urinary tract symptoms.  Does complain of fatigue and some hot flashes since he received his ADT therapy.  PLANNED TREATMENT REGIMEN: I-125 interstitial implant  PAST MEDICAL HISTORY:  has a past medical history of Hyperlipidemia, Hypertension, Pre-diabetes, and Prostate cancer ([name]).    PAST SURGICAL HISTORY:  Past Surgical History:  Procedure Laterality Date   APPENDECTOMY     COLONOSCOPY  2011   FRACTURE SURGERY Right    hand   VOLUME STUDY N/A 02/27/2021   Procedure: VOLUME STUDY;  Surgeon: Billey Co, MD;  Location: [facility] ORS;  Service: Urology;  Laterality: N/A;    FAMILY HISTORY: family history is not on file.  SOCIAL HISTORY:  reports that he has never  smoked. He has never used smokeless tobacco. He reports that he does not drink alcohol and does not use drugs.  ALLERGIES: Patient has no known allergies.  MEDICATIONS:  Current Outpatient Medications  Medication Sig Dispense Refill   amLODipine (NORVASC) 10 MG tablet Take 10 mg by mouth at bedtime.     atorvastatin (LIPITOR) 40 MG tablet Take 40 mg by mouth at bedtime.     Calcium Carbonate-Vit D-Min (CALCIUM 1200 PO) Take 1,200 mg by mouth daily.     cholecalciferol (VITAMIN D3) 25 MCG (1000 UNIT) tablet Take 1,000 Units by mouth daily.     naproxen sodium (ALEVE) 220 MG tablet Take 220 mg by mouth daily as needed.     tadalafil (CIALIS) 10 MG tablet Take 1 tablet (10 mg total) by mouth every other day. 30 tablet 5   No current facility-administered medications for this encounter.    ECOG PERFORMANCE STATUS:  0 - Asymptomatic  REVIEW OF SYSTEMS: Patient denies any weight loss, fatigue, weakness, fever, chills or night sweats. Patient denies any loss of vision, blurred vision. Patient denies any ringing  of the ears or hearing loss. No irregular heartbeat. Patient denies heart murmur or history of fainting. Patient denies any chest pain or pain radiating to her upper extremities. Patient denies any shortness of breath, difficulty breathing at night, cough or hemoptysis. Patient denies any swelling in the lower legs. Patient denies any nausea vomiting, vomiting of blood, or coffee ground material in the vomitus. Patient denies any stomach  pain. Patient states has had normal bowel movements no significant constipation or diarrhea. Patient denies any dysuria, hematuria or significant nocturia. Patient denies any problems walking, swelling in the joints or loss of balance. Patient denies any skin changes, loss of hair or loss of weight. Patient denies any excessive worrying or anxiety or significant depression. Patient denies any problems with insomnia. Patient denies excessive thirst, polyuria,  polydipsia. Patient denies any swollen glands, patient denies easy bruising or easy bleeding. Patient denies any recent infections, allergies or URI. Patient "s visual fields have not changed significantly in recent time.   PHYSICAL EXAM: BP (!) 158/87 (BP Location: Left Arm, Patient Position: Sitting)   Pulse 76   Temp (!) 97.2 F (36.2 C) (Tympanic)   Resp 16   Wt 222 lb (100.7 kg)   BMI 32.78 kg/m  Well-developed well-nourished patient in NAD. HEENT reveals PERLA, EOMI, discs not visualized.  Oral cavity is clear. No oral mucosal lesions are identified. Neck is clear without evidence of cervical or supraclavicular adenopathy. Lungs are clear to A&P. Cardiac examination is essentially unremarkable with regular rate and rhythm without murmur rub or thrill. Abdomen is benign with no organomegaly or masses noted. Motor sensory and DTR levels are equal and symmetric in the upper and lower extremities. Cranial nerves II through XII are grossly intact. Proprioception is intact. No peripheral adenopathy or edema is identified. No motor or sensory levels are noted. Crude visual fields are within normal range.  LABORATORY DATA: Pathology reviewed    RADIOLOGY RESULTS: Volume study performed under ultrasound   IMPRESSION: Stage IIa adenocarcinoma the prostate Gleason score 7 (3+4) for I-125 interstitial implant in 65 year old male  PLAN: This time patient is cleared to go ahead with I-125 interstitial implant.  Risks and benefits of treatment clued increased lower urinary tract symptoms possible diarrhea fatigue alteration of blood counts and risks of general anesthesia were reviewed we also reviewed radiation safety precautions since he will be radioactive for 2 months.  Patient and wife comprehend my treatment plan well.  I would like to take this opportunity to thank you for allowing me to participate in the care of your patient.Noreene Filbert, MD

## 2021-03-04 ENCOUNTER — Ambulatory Visit
Admission: RE | Admit: 2021-03-04 | Discharge: 2021-03-04 | Disposition: A | Discharge status: Home / Self Care | Payer: Federal, State, Local not specified - PPO | Source: Ambulatory Visit | Attending: Radiation Oncology | Admitting: Radiation Oncology

## 2021-03-04 DIAGNOSIS — C61 Malignant neoplasm of prostate: Secondary | ICD-10-CM | POA: Insufficient documentation

## 2021-03-28 ENCOUNTER — Encounter
Admission: RE | Disposition: A | Discharge status: Home / Self Care | Payer: Self-pay | Source: Home / Self Care | Attending: Urology

## 2021-03-28 ENCOUNTER — Other Ambulatory Visit: Payer: Self-pay

## 2021-03-28 ENCOUNTER — Encounter: Payer: Self-pay | Admitting: Urology

## 2021-03-28 ENCOUNTER — Ambulatory Visit: Payer: Federal, State, Local not specified - PPO | Admitting: Certified Registered"

## 2021-03-28 ENCOUNTER — Ambulatory Visit
Admission: RE | Admit: 2021-03-28 | Discharge: 2021-03-28 | Disposition: A | Discharge status: Home / Self Care | Payer: Federal, State, Local not specified - PPO | Attending: Urology | Admitting: Urology

## 2021-03-28 ENCOUNTER — Ambulatory Visit: Payer: Federal, State, Local not specified - PPO

## 2021-03-28 DIAGNOSIS — C61 Malignant neoplasm of prostate: Secondary | ICD-10-CM | POA: Diagnosis not present

## 2021-03-28 DIAGNOSIS — R7303 Prediabetes: Secondary | ICD-10-CM | POA: Diagnosis not present

## 2021-03-28 HISTORY — PX: RADIOACTIVE SEED IMPLANT: SHX5150

## 2021-03-28 LAB — GLUCOSE, CAPILLARY
Glucose-Capillary: 216 mg/dL — ABNORMAL HIGH (ref 70–99)
Glucose-Capillary: 177 mg/dL — ABNORMAL HIGH (ref 70–99)

## 2021-03-28 SURGERY — INSERTION, RADIATION SOURCE, PROSTATE
Anesthesia: General | Site: Perineum

## 2021-03-28 MED ORDER — FAMOTIDINE 20 MG PO TABS
ORAL_TABLET | ORAL | Status: AC
  Administered 2021-03-28: 20 mg via ORAL
  Filled 2021-03-28: qty 1

## 2021-03-28 MED ORDER — CEFAZOLIN SODIUM-DEXTROSE 2-4 GM/100ML-% IV SOLN
2.0000 g | Freq: Three times a day (TID) | INTRAVENOUS | Status: DC
  Administered 2021-03-28: 2 g via INTRAVENOUS

## 2021-03-28 MED ORDER — ORAL CARE MOUTH RINSE: 15.0000 mL | Freq: Once | OROMUCOSAL | Status: AC

## 2021-03-28 MED ORDER — PHENYLEPHRINE HCL (PRESSORS) 10 MG/ML IV SOLN
INTRAVENOUS | Status: AC
  Filled 2021-03-28: qty 1

## 2021-03-28 MED ORDER — LACTATED RINGERS IV SOLN: INTRAVENOUS | Status: DC

## 2021-03-28 MED ORDER — FAMOTIDINE 20 MG PO TABS: 20.0000 mg | ORAL_TABLET | Freq: Once | ORAL | Status: AC

## 2021-03-28 MED ORDER — OXYCODONE HCL 5 MG PO TABS
5.0000 mg | ORAL_TABLET | Freq: Once | ORAL | Status: AC | PRN
Start: 2021-03-28 — End: 2021-03-28
  Administered 2021-03-28: 5 mg via ORAL

## 2021-03-28 MED ORDER — SUCCINYLCHOLINE CHLORIDE 200 MG/10ML IV SOSY
PREFILLED_SYRINGE | INTRAVENOUS | Status: DC | PRN
  Administered 2021-03-28: 160 mg via INTRAVENOUS

## 2021-03-28 MED ORDER — CEFAZOLIN SODIUM-DEXTROSE 2-4 GM/100ML-% IV SOLN
INTRAVENOUS | Status: AC
  Filled 2021-03-28: qty 100

## 2021-03-28 MED ORDER — DEXMEDETOMIDINE HCL IN NACL 400 MCG/100ML IV SOLN
INTRAVENOUS | Status: DC | PRN
  Administered 2021-03-28: 8 ug via INTRAVENOUS

## 2021-03-28 MED ORDER — SODIUM CHLORIDE 0.9 % IR SOLN
Status: DC | PRN
  Administered 2021-03-28: 200 mL

## 2021-03-28 MED ORDER — PHENYLEPHRINE HCL (PRESSORS) 10 MG/ML IV SOLN
INTRAVENOUS | Status: DC | PRN
  Administered 2021-03-28: 50 ug via INTRAVENOUS
  Administered 2021-03-28: 100 ug via INTRAVENOUS

## 2021-03-28 MED ORDER — PROPOFOL 10 MG/ML IV BOLUS
INTRAVENOUS | Status: AC
  Filled 2021-03-28: qty 40

## 2021-03-28 MED ORDER — HYDROCODONE-ACETAMINOPHEN 5-325 MG PO TABS: 1.0000 | ORAL_TABLET | ORAL | 0 refills | Status: AC | PRN

## 2021-03-28 MED ORDER — SUGAMMADEX SODIUM 200 MG/2ML IV SOLN
INTRAVENOUS | Status: DC | PRN
  Administered 2021-03-28: 200 mg via INTRAVENOUS

## 2021-03-28 MED ORDER — ONDANSETRON HCL 4 MG/2ML IJ SOLN
INTRAMUSCULAR | Status: DC | PRN
  Administered 2021-03-28: 4 mg via INTRAVENOUS

## 2021-03-28 MED ORDER — CHLORHEXIDINE GLUCONATE 0.12 % MT SOLN
15.0000 mL | Freq: Once | OROMUCOSAL | Status: AC
  Administered 2021-03-28: 15 mL via OROMUCOSAL

## 2021-03-28 MED ORDER — LIDOCAINE HCL (PF) 2 % IJ SOLN
INTRAMUSCULAR | Status: AC
  Filled 2021-03-28: qty 5

## 2021-03-28 MED ORDER — BACITRACIN 500 UNIT/GM EX OINT
TOPICAL_OINTMENT | CUTANEOUS | Status: DC | PRN
  Administered 2021-03-28: 1 via TOPICAL

## 2021-03-28 MED ORDER — DEXAMETHASONE SODIUM PHOSPHATE 10 MG/ML IJ SOLN
INTRAMUSCULAR | Status: DC | PRN
  Administered 2021-03-28: 4 mg via INTRAVENOUS

## 2021-03-28 MED ORDER — FENTANYL CITRATE (PF) 100 MCG/2ML IJ SOLN
INTRAMUSCULAR | Status: AC
  Filled 2021-03-28: qty 2

## 2021-03-28 MED ORDER — TAMSULOSIN HCL 0.4 MG PO CAPS: 0.4000 mg | ORAL_CAPSULE | Freq: Every day | ORAL | 0 refills | Status: DC

## 2021-03-28 MED ORDER — OXYCODONE HCL 5 MG/5ML PO SOLN
5.0000 mg | Freq: Once | ORAL | Status: AC | PRN
Start: 2021-03-28 — End: 2021-03-28

## 2021-03-28 MED ORDER — BACITRACIN ZINC 500 UNIT/GM EX OINT
TOPICAL_OINTMENT | CUTANEOUS | Status: AC
  Filled 2021-03-28: qty 28.35

## 2021-03-28 MED ORDER — PROPOFOL 10 MG/ML IV BOLUS
INTRAVENOUS | Status: DC | PRN
  Administered 2021-03-28: 300 mg via INTRAVENOUS

## 2021-03-28 MED ORDER — FENTANYL CITRATE (PF) 100 MCG/2ML IJ SOLN
25.0000 ug | INTRAMUSCULAR | Status: DC | PRN
  Administered 2021-03-28: 25 ug via INTRAVENOUS

## 2021-03-28 MED ORDER — MIDAZOLAM HCL 2 MG/2ML IJ SOLN
INTRAMUSCULAR | Status: AC
  Filled 2021-03-28: qty 2

## 2021-03-28 MED ORDER — LIDOCAINE HCL (CARDIAC) PF 100 MG/5ML IV SOSY
PREFILLED_SYRINGE | INTRAVENOUS | Status: DC | PRN
  Administered 2021-03-28: 100 mg via INTRAVENOUS

## 2021-03-28 MED ORDER — SEVOFLURANE IN SOLN
RESPIRATORY_TRACT | Status: AC
  Filled 2021-03-28: qty 250

## 2021-03-28 MED ORDER — FLEET ENEMA 7-19 GM/118ML RE ENEM
1.0000 | ENEMA | Freq: Once | RECTAL | Status: AC
  Administered 2021-03-28: 1 via RECTAL

## 2021-03-28 MED ORDER — EPHEDRINE SULFATE 50 MG/ML IJ SOLN
INTRAMUSCULAR | Status: DC | PRN
  Administered 2021-03-28: 15 mg via INTRAVENOUS

## 2021-03-28 MED ORDER — MIDAZOLAM HCL 2 MG/2ML IJ SOLN
INTRAMUSCULAR | Status: DC | PRN
  Administered 2021-03-28: 2 mg via INTRAVENOUS

## 2021-03-28 MED ORDER — OXYCODONE HCL 5 MG PO TABS
ORAL_TABLET | ORAL | Status: AC
  Filled 2021-03-28: qty 1

## 2021-03-28 MED ORDER — 0.9 % SODIUM CHLORIDE (POUR BTL) OPTIME
TOPICAL | Status: DC | PRN
  Administered 2021-03-28: 250 mL

## 2021-03-28 MED ORDER — ROCURONIUM BROMIDE 100 MG/10ML IV SOLN
INTRAVENOUS | Status: DC | PRN
  Administered 2021-03-28: 40 mg via INTRAVENOUS

## 2021-03-28 MED ORDER — FENTANYL CITRATE (PF) 100 MCG/2ML IJ SOLN
INTRAMUSCULAR | Status: DC | PRN
  Administered 2021-03-28: 50 ug via INTRAVENOUS

## 2021-03-28 SURGICAL SUPPLY — 27 items
BAG DRN RND TRDRP ANRFLXCHMBR (UROLOGICAL SUPPLIES) ×1
BAG URINE DRAIN 2000ML AR STRL (UROLOGICAL SUPPLIES) ×2 IMPLANT
BLADE CLIPPER SURG (BLADE) ×2 IMPLANT
BRUSH SCRUB EZ 1% IODOPHOR (MISCELLANEOUS) ×2 IMPLANT
CATH FOL 2WAY LX 16X5 (CATHETERS) ×2 IMPLANT
COVER BACK TABLE REUSABLE LG (DRAPES) ×2 IMPLANT
DRAPE INCISE 23X17 IOBAN STRL (DRAPES) ×1
DRAPE INCISE IOBAN 23X17 STRL (DRAPES) ×1 IMPLANT
DRAPE UNDER BUTTOCK W/FLU (DRAPES) ×2 IMPLANT
DRSG TELFA 3X8 NADH (GAUZE/BANDAGES/DRESSINGS) ×2 IMPLANT
GAUZE 4X4 16PLY ~~LOC~~+RFID DBL (SPONGE) ×4 IMPLANT
GLOVE SURG ENC MOIS LTX SZ7.5 (GLOVE) ×4 IMPLANT
GLOVE SURG UNDER POLY LF SZ7.5 (GLOVE) ×4 IMPLANT
GOWN STRL REUS W/ TWL LRG LVL3 (GOWN DISPOSABLE) ×1 IMPLANT
GOWN STRL REUS W/ TWL XL LVL3 (GOWN DISPOSABLE) ×2 IMPLANT
GOWN STRL REUS W/TWL LRG LVL3 (GOWN DISPOSABLE) ×2
GOWN STRL REUS W/TWL XL LVL3 (GOWN DISPOSABLE) ×4
IV NS 1000ML (IV SOLUTION) ×2
IV NS 1000ML BAXH (IV SOLUTION) ×1 IMPLANT
KIT TURNOVER CYSTO (KITS) ×2 IMPLANT
MANIFOLD NEPTUNE II (INSTRUMENTS) IMPLANT
PACK CYSTO AR (MISCELLANEOUS) ×2 IMPLANT
SET CYSTO W/LG BORE CLAMP LF (SET/KITS/TRAYS/PACK) ×2 IMPLANT
SURGILUBE 2OZ TUBE FLIPTOP (MISCELLANEOUS) ×2 IMPLANT
SYR 10ML LL (SYRINGE) ×2 IMPLANT
WATER STERILE IRR 1000ML POUR (IV SOLUTION) ×2 IMPLANT
WATER STERILE IRR 500ML POUR (IV SOLUTION) ×2 IMPLANT

## 2021-03-28 NOTE — Progress Notes (Signed)
Radiation Oncology I-125 interstitial implant note  Name: Edward Harmon   Date:   01/03/2021 MRN:  [id number] DOB: 1956-04-14    This 65 y.o. male presents to the OR today for I-125 interstitial implant for Gleason 7 (3+4) adenocarcinoma presenting with a PSA of 15 REFERRING PROVIDER: No ref. provider found  HPI: Patient is a.  65 year old male who presented with a PSA in the 15 range.  Biopsy was positive for 9 of 12 cores mostly Gleason 7 (3+4).  No evidence of advanced local disease.  He opted for I-125 interstitial implant.  He was taken to the OR today for that procedure.  COMPLICATIONS OF TREATMENT: none  FOLLOW UP COMPLIANCE: keeps appointments   PHYSICAL EXAM:  BP (!) 157/93   Pulse 91   Temp (!) 97 F (36.1 C) (Temporal)   Resp 16   Ht 5\' 9"  (1.753 m)   Wt 216 lb (98 kg)   SpO2 95%   BMI 31.90 kg/m  Well-developed well-nourished patient in NAD. HEENT reveals PERLA, EOMI, discs not visualized.  Oral cavity is clear. No oral mucosal lesions are identified. Neck is clear without evidence of cervical or supraclavicular adenopathy. Lungs are clear to A&P. Cardiac examination is essentially unremarkable with regular rate and rhythm without murmur rub or thrill. Abdomen is benign with no organomegaly or masses noted. Motor sensory and DTR levels are equal and symmetric in the upper and lower extremities. Cranial nerves II through XII are grossly intact. Proprioception is intact. No peripheral adenopathy or edema is identified. No motor or sensory levels are noted. Crude visual fields are within normal range.  RADIOLOGY RESULTS: Ultrasound used for source placement  PLAN: Patient was taken to the operating room and general anesthesia was administered. Legs were immobilized in stirrups and patient was positioned in the exact same proportions as original volume study. Patient was prepped and Foley catheter was placed. Ultrasound guidance identified the prostate and recreated the  original set up as per treatment planning volume study. 27 needles were placed under ultrasound guidance with PVCs delivered to the prostate volume. After completion of procedure cystoscopy was performed by urology and no evidence of seeds in the bladder were noted. Patient tolerated the procedure extremely well. Initial plain film as doublecheck identified 99 seeds in the prostate. Patient has followup appointment in one month for CT scan for quality assurance will be performed.    Noreene Filbert, MD

## 2021-03-28 NOTE — H&P (Signed)
   03/28/21 7:17 AM   Edward Harmon 1955-09-07 [id number]  CC: prostate cancer  HPI: 65 year old male with intermediate risk prostate cancer who is opted for brachytherapy.     PMH: Past Medical History:  Diagnosis Date   Hyperlipidemia    Hypertension    Pre-diabetes    Prostate cancer [facility])     Surgical History: Past Surgical History:  Procedure Laterality Date   APPENDECTOMY     COLONOSCOPY  2011   FRACTURE SURGERY Right    hand   VOLUME STUDY N/A 02/27/2021   Procedure: VOLUME STUDY;  Surgeon: Billey Co, MD;  Location: [facility] ORS;  Service: Urology;  Laterality: N/A;      Family History: Family History  Problem Relation Age of Onset   Prostate cancer Neg Hx     Social History:  reports that he has never smoked. He has never used smokeless tobacco. He reports that he does not drink alcohol and does not use drugs.  Physical Exam: BP (!) 151/91   Pulse (!) 105   Temp 98.6 F (37 C) (Oral)   Resp 16   Ht 5\' 9"  (1.753 m)   Wt 98 kg   SpO2 94%   BMI 31.90 kg/m    Constitutional:  Alert and oriented, No acute distress. Cardiovascular: RRR Respiratory: CTA bilaterally GI: Abdomen is soft, nontender, nondistended, no abdominal masses  Assessment & Plan:   65 year old male with intermediate risk prostate cancer, he has opted for brachytherapy.  We discussed the risks of bleeding, infection, urinary symptoms/retention, possible need for additional treatments, and need for long-term PSA monitoring.  Edward Madrid, MD 03/28/2021  [facility] [address], [place], [place] [address] [phone]

## 2021-03-28 NOTE — Discharge Instructions (Signed)
AMBULATORY SURGERY  ?DISCHARGE INSTRUCTIONS ? ? ?The drugs that you were given will stay in your system until tomorrow so for the next 24 hours you should not: ? ?Drive an automobile ?Make any legal decisions ?Drink any alcoholic beverage ? ? ?You may resume regular meals tomorrow.  Today it is better to start with liquids and gradually work up to solid foods. ? ?You may eat anything you prefer, but it is better to start with liquids, then soup and crackers, and gradually work up to solid foods. ? ? ?Please notify your doctor immediately if you have any unusual bleeding, trouble breathing, redness and pain at the surgery site, drainage, fever, or pain not relieved by medication. ? ? ? ?Additional Instructions: ? ? ? ?Please contact your physician with any problems or Same Day Surgery at [phone], Monday through Friday 6 am to 4 pm, or [place] [facility] [place] [facility] number at [phone].  ?

## 2021-03-28 NOTE — Op Note (Signed)
Preoperative diagnosis: Adenocarcinoma of the prostate    Postoperative diagnosis: Same    Procedure: I-125 prostate seed implantation, cystoscopy   Surgeon: Nickolas Madrid, MD   Radiation Oncologist: Noreene Filbert, M.D.    Anesthesia: General   Drains: none   Complications: none   Indications: Prostate cancer   Procedure: The patient was brought to operating suite and placement table in the supine position. At this time, a universal timeout protocol was performed, all team members were identified, Venodyne boots are placed, and he was administered IV Ancef in the preoperative period. He was placed in lithotomy position and prepped and draped in usual fashion. The radiation oncology department placed a transrectal ultrasound probe anchoring stand/ grid and aligned with previous imaging from the volume study. Foley catheter was inserted without difficulty.  All needle passage was done with real-time transrectal ultrasound guidance in both the transverse and sagittal plains in order to achieve the desired preplanned position. A total of 27 needles were placed.  99 active seeds were implanted. The Foley catheter was removed and a rigid cystoscopy failed to show any seeds outside the prostate without evidence of trauma to the urethral, prostatic fossa, or bladder.  The bladder was drained.  A fluoroscopic image was then obtained showing excellent distrubution of the brachytherapy seeds.  Each seed was counted and counts were correct.     The patient was then repositioned in the supine position, reversed from anesthesia, and taken to the PACU in stable condition.  Nickolas Madrid, MD 03/28/2021

## 2021-03-28 NOTE — Anesthesia Procedure Notes (Signed)
Procedure Name: Intubation Date/Time: 03/28/2021 7:41 AM Performed by: Kerri Perches, CRNA Pre-anesthesia Checklist: Patient identified, Emergency Drugs available, Patient being monitored and Suction available Patient Re-evaluated:Patient Re-evaluated prior to induction Oxygen Delivery Method: Circle system utilized Preoxygenation: Pre-oxygenation with 100% oxygen Induction Type: IV induction Laryngoscope Size: McGraph and 3 Grade View: Grade I Tube type: Oral Tube size: 7.5 mm Number of attempts: 1 Airway Equipment and Method: Stylet Placement Confirmation: ETT inserted through vocal cords under direct vision, positive ETCO2 and breath sounds checked- equal and bilateral Secured at: 23 cm Tube secured with: Tape Dental Injury: Teeth and Oropharynx as per pre-operative assessment

## 2021-03-28 NOTE — Transfer of Care (Signed)
Immediate Anesthesia Transfer of Care Note  Patient: Annye English  Procedure(s) Performed: RADIOACTIVE SEED IMPLANT/BRACHYTHERAPY IMPLANT (Perineum)  Patient Location: PACU  Anesthesia Type:General  Level of Consciousness: sedated  Airway & Oxygen Therapy: Patient Spontanous Breathing and Patient connected to nasal cannula oxygen  Post-op Assessment: Report given to RN and Post -op Vital signs reviewed and stable  Post vital signs: Reviewed and stable  Last Vitals:  Vitals Value Taken Time  BP 132/76 03/28/21 0831  Temp    Pulse 87 03/28/21 0831  Resp 16 03/28/21 0831  SpO2 99 % 03/28/21 0831  Vitals shown include unvalidated device data.  Last Pain:  Vitals:   03/28/21 0634  TempSrc: Oral  PainSc: 0-No pain         Complications: No notable events documented.

## 2021-03-28 NOTE — Anesthesia Preprocedure Evaluation (Signed)
Anesthesia Evaluation  Patient identified by MRN, date of birth, ID band Patient awake    Reviewed: Allergy & Precautions, NPO status , Patient's Chart, lab work & pertinent test results  History of Anesthesia Complications Negative for: history of anesthetic complications  Airway Mallampati: III  TM Distance: >3 FB Neck ROM: full    Dental  (+) Chipped   Pulmonary neg pulmonary ROS, neg shortness of breath,    Pulmonary exam normal        Cardiovascular Exercise Tolerance: Good hypertension, (-) angina(-) Past MI and (-) DOE Normal cardiovascular exam     Neuro/Psych negative neurological ROS  negative psych ROS   GI/Hepatic negative GI ROS, Neg liver ROS, neg GERD  ,  Endo/Other  negative endocrine ROS  Renal/GU      Musculoskeletal   Abdominal   Peds  Hematology negative hematology ROS (+)   Anesthesia Other Findings Past Medical History: No date: Hyperlipidemia No date: Hypertension No date: Pre-diabetes No date: Prostate cancer [facility])  Past Surgical History: No date: APPENDECTOMY 2011: COLONOSCOPY No date: FRACTURE SURGERY; Right     Comment:  hand 02/27/2021: VOLUME STUDY; N/A     Comment:  Procedure: VOLUME STUDY;  Surgeon: Billey Co, MD;              Location: [facility] ORS;  Service: Urology;  Laterality: N/A;  BMI    Body Mass Index: 31.90 kg/m      Reproductive/Obstetrics negative OB ROS                             Anesthesia Physical Anesthesia Plan  ASA: 2  Anesthesia Plan: General ETT   Post-op Pain Management:    Induction: Intravenous  PONV Risk Score and Plan: Ondansetron, Dexamethasone, Midazolam and Treatment may vary due to age or medical condition  Airway Management Planned: Oral ETT  Additional Equipment:   Intra-op Plan:   Post-operative Plan: Extubation in OR  Informed Consent: I have reviewed the patients History and Physical, chart,  labs and discussed the procedure including the risks, benefits and alternatives for the proposed anesthesia with the patient or authorized representative who has indicated his/her understanding and acceptance.     Dental Advisory Given  Plan Discussed with: Anesthesiologist, CRNA and Surgeon  Anesthesia Plan Comments: (Patient consented for risks of anesthesia including but not limited to:  - adverse reactions to medications - damage to eyes, teeth, lips or other oral mucosa - nerve damage due to positioning  - sore throat or hoarseness - Damage to heart, brain, nerves, lungs, other parts of body or loss of life  Patient voiced understanding.)        Anesthesia Quick Evaluation

## 2021-03-28 NOTE — Anesthesia Postprocedure Evaluation (Signed)
Anesthesia Post Note  Patient: Edward Harmon  Procedure(s) Performed: RADIOACTIVE SEED IMPLANT/BRACHYTHERAPY IMPLANT (Perineum)  Patient location during evaluation: Endoscopy Anesthesia Type: General Level of consciousness: awake and alert Pain management: pain level controlled Vital Signs Assessment: post-procedure vital signs reviewed and stable Respiratory status: spontaneous breathing, nonlabored ventilation, respiratory function stable and patient connected to nasal cannula oxygen Cardiovascular status: blood pressure returned to baseline and stable Postop Assessment: no apparent nausea or vomiting Anesthetic complications: no   No notable events documented.   Last Vitals:  Vitals:   03/28/21 0907 03/28/21 0918  BP:  (!) 157/93  Pulse: 86 91  Resp: 13 16  Temp: 36.4 C (!) 36.1 C  SpO2: 96% 95%    Last Pain:  Vitals:   03/28/21 0918  TempSrc: Temporal  PainSc: 6                  Precious Haws Edker Punt

## 2021-03-31 ENCOUNTER — Telehealth: Payer: Self-pay

## 2021-03-31 ENCOUNTER — Telehealth: Payer: Self-pay | Admitting: Licensed Clinical Social Worker

## 2021-03-31 NOTE — Telephone Encounter (Signed)
Patient's wife called stating he is very fatigued and having trouble walking with out assistance. He is also sleeping a lot after having seed implant done on Friday. He is eating well and drinking, denies fever, chills, no chest pain, no swelling in lower extremities. Wife states that he fell yesterday and was not acting like himself, she said he did not hit his head or injure himself in the fall. Patient has no urinary symptoms and is in no pain from surgery. He has not yet had a bowel movement since surgery, she was encourage to have him use OTC colace or dulcolax and increase water intake to help with constipation. It was explained that it would be very unlikely that his weakness and trouble walking would be related to the seed implant, possible side effect to anesthesia. It was suggested that they contact his PCP to follow up for vitals check and further evaluation after fall, or possibly urgent care or ED if symptoms worsen. Patient's wife verbalized understanding

## 2021-03-31 NOTE — Telephone Encounter (Signed)
Patient's wife called stating the patient was  sleeping a lot after having seed implant done on Friday. Wife stated that he fell yesterday and was not acting like himself. Per Dr. Baruch Gouty this is not related to the seed implant and suggested wife take him to PCP, urgent care or call Dr. Dayton Bailiff office. Wife acknowledge agreement.

## 2021-04-19 ENCOUNTER — Other Ambulatory Visit: Payer: Self-pay | Admitting: Urology

## 2021-04-21 ENCOUNTER — Ambulatory Visit
Admission: RE | Admit: 2021-04-21 | Discharge: 2021-04-21 | Disposition: A | Discharge status: Home / Self Care | Payer: Federal, State, Local not specified - PPO | Source: Ambulatory Visit | Attending: Radiation Oncology | Admitting: Radiation Oncology

## 2021-04-21 ENCOUNTER — Other Ambulatory Visit: Payer: Self-pay

## 2021-04-21 ENCOUNTER — Other Ambulatory Visit: Payer: Self-pay | Admitting: *Deleted

## 2021-04-21 VITALS — BP 141/82 | HR 111 | Temp 97.8°F | Resp 20 | Wt 215.1 lb

## 2021-04-21 DIAGNOSIS — C61 Malignant neoplasm of prostate: Secondary | ICD-10-CM | POA: Insufficient documentation

## 2021-04-21 DIAGNOSIS — Z923 Personal history of irradiation: Secondary | ICD-10-CM | POA: Diagnosis not present

## 2021-04-21 MED ORDER — MIRABEGRON ER 25 MG PO TB24: 25.0000 mg | ORAL_TABLET | Freq: Every day | ORAL | 5 refills | Status: DC

## 2021-04-21 NOTE — Progress Notes (Signed)
Radiation Oncology Follow up Note  Name: Edward Harmon   Date:   04/21/2021 MRN:  [id number] DOB: 1955-10-13    This 65 y.o. male presents to the clinic today for 1 month follow-up status post I-125 interstitial implant for Gleason 7 (3+4) adenocarcinoma the prostate presenting with a PSA of 15.  REFERRING PROVIDER: Wayland Denis, PA-C  HPI: Patient is a 64 year old male now at 1 month having completed I-125 interstitial implant for Gleason 7 adenocarcinoma the prostate presenting with a PSA in the 15 range.  Seen today he has multiple complaints mostly to pertaining to what sounds like hypotension fatigue passing out at times.  He continues to have urinary frequency and urgency even though he is on Flomax.  His blood pressure was fine today at 141/82..  We performed a CT scan for quality assurance of his source placement and initial review shows excellent source placement.  COMPLICATIONS OF TREATMENT: none  FOLLOW UP COMPLIANCE: keeps appointments   PHYSICAL EXAM:  BP (!) 141/82   Pulse (!) 111   Temp 97.8 F (36.6 C)   Resp 20   Wt 215 lb 1.6 oz (97.6 kg)   SpO2 100%   BMI 31.76 kg/m  Well-developed well-nourished patient in NAD. HEENT reveals PERLA, EOMI, discs not visualized.  Oral cavity is clear. No oral mucosal lesions are identified. Neck is clear without evidence of cervical or supraclavicular adenopathy. Lungs are clear to A&P. Cardiac examination is essentially unremarkable with regular rate and rhythm without murmur rub or thrill. Abdomen is benign with no organomegaly or masses noted. Motor sensory and DTR levels are equal and symmetric in the upper and lower extremities. Cranial nerves II through XII are grossly intact. Proprioception is intact. No peripheral adenopathy or edema is identified. No motor or sensory levels are noted. Crude visual fields are within normal range.  RADIOLOGY RESULTS: CT scan for QA reviewed compatible with above-stated findings showing  excellent source placement  PLAN: Present time of asked the patient to make an appointment with his PMD.  I am can discontinue his Flomax as he is already on current hypertensive medication.  I have asked him to start Myrbetriq although cost is prohibitive.  He will start a Vo 3 times a day.  I have also asked him to contact urology should his urinary symptoms persist although he is currently hot from his radiation implant and I have assured him the symptoms will reside over time.  He states his problems with his fatigue and other problems started around the time of his Eligard injection.  I have asked to see him back in 3 months for follow-up with a PSA at that time.  Patient will contact his PMD.  I would like to take this opportunity to thank you for allowing me to participate in the care of your patient.Noreene Filbert, MD

## 2021-04-23 DIAGNOSIS — C61 Malignant neoplasm of prostate: Secondary | ICD-10-CM | POA: Diagnosis present

## 2021-04-26 ENCOUNTER — Emergency Department
Admission: EM | Admit: 2021-04-26 | Discharge: 2021-04-26 | Disposition: A | Discharge status: Home / Self Care | Payer: Federal, State, Local not specified - PPO | Attending: Emergency Medicine | Admitting: Emergency Medicine

## 2021-04-26 ENCOUNTER — Emergency Department: Payer: Federal, State, Local not specified - PPO

## 2021-04-26 ENCOUNTER — Other Ambulatory Visit: Payer: Self-pay

## 2021-04-26 ENCOUNTER — Encounter: Payer: Self-pay | Admitting: Intensive Care

## 2021-04-26 DIAGNOSIS — R3589 Other polyuria: Secondary | ICD-10-CM | POA: Diagnosis not present

## 2021-04-26 DIAGNOSIS — R739 Hyperglycemia, unspecified: Secondary | ICD-10-CM | POA: Diagnosis not present

## 2021-04-26 DIAGNOSIS — R0602 Shortness of breath: Secondary | ICD-10-CM | POA: Insufficient documentation

## 2021-04-26 DIAGNOSIS — I10 Essential (primary) hypertension: Secondary | ICD-10-CM | POA: Diagnosis not present

## 2021-04-26 DIAGNOSIS — Z20822 Contact with and (suspected) exposure to covid-19: Secondary | ICD-10-CM | POA: Insufficient documentation

## 2021-04-26 DIAGNOSIS — R531 Weakness: Secondary | ICD-10-CM | POA: Insufficient documentation

## 2021-04-26 DIAGNOSIS — Z8546 Personal history of malignant neoplasm of prostate: Secondary | ICD-10-CM | POA: Diagnosis not present

## 2021-04-26 DIAGNOSIS — H538 Other visual disturbances: Secondary | ICD-10-CM | POA: Insufficient documentation

## 2021-04-26 DIAGNOSIS — Z79899 Other long term (current) drug therapy: Secondary | ICD-10-CM | POA: Insufficient documentation

## 2021-04-26 LAB — BASIC METABOLIC PANEL
Anion gap: 12 (ref 5–15)
BUN: 24 mg/dL — ABNORMAL HIGH (ref 8–23)
CO2: 26 mmol/L (ref 22–32)
Calcium: 9.5 mg/dL (ref 8.9–10.3)
Chloride: 91 mmol/L — ABNORMAL LOW (ref 98–111)
Creatinine, Ser: 1.56 mg/dL — ABNORMAL HIGH (ref 0.61–1.24)
GFR, Estimated: 49 mL/min — ABNORMAL LOW (ref >60)
Glucose, Bld: 674 mg/dL (ref 70–99)
Potassium: 4.5 mmol/L (ref 3.5–5.1)
Sodium: 129 mmol/L — ABNORMAL LOW (ref 135–145)

## 2021-04-26 LAB — URINALYSIS, ROUTINE W REFLEX MICROSCOPIC
Bacteria, UA: NONE SEEN
Bilirubin Urine: NEGATIVE
Glucose, UA: >=500 mg/dL — AB
Hgb urine dipstick: NEGATIVE
Ketones, ur: NEGATIVE mg/dL
Leukocytes,Ua: NEGATIVE
Nitrite: NEGATIVE
Protein, ur: NEGATIVE mg/dL
Specific Gravity, Urine: 1.028 (ref 1.005–1.030)
pH: 5 (ref 5.0–8.0)

## 2021-04-26 LAB — CBC
HCT: 36 % — ABNORMAL LOW (ref 39.0–52.0)
Hemoglobin: 13.1 g/dL (ref 13.0–17.0)
MCH: 30 pg (ref 26.0–34.0)
MCHC: 36.4 g/dL — ABNORMAL HIGH (ref 30.0–36.0)
MCV: 82.6 fL (ref 80.0–100.0)
Platelets: 264 10*3/uL (ref 150–400)
RBC: 4.36 MIL/uL (ref 4.22–5.81)
RDW: 11.7 % (ref 11.5–15.5)
WBC: 6 10*3/uL (ref 4.0–10.5)
nRBC: 0 % (ref 0.0–0.2)

## 2021-04-26 LAB — RESP PANEL BY RT-PCR (FLU A&B, COVID) ARPGX2
Influenza A by PCR: NEGATIVE
Influenza B by PCR: NEGATIVE
SARS Coronavirus 2 by RT PCR: NEGATIVE

## 2021-04-26 LAB — CBG MONITORING, ED
Glucose-Capillary: 542 mg/dL (ref 70–99)
Glucose-Capillary: 381 mg/dL — ABNORMAL HIGH (ref 70–99)
Glucose-Capillary: 308 mg/dL — ABNORMAL HIGH (ref 70–99)

## 2021-04-26 LAB — TROPONIN I (HIGH SENSITIVITY): Troponin I (High Sensitivity): 4 ng/L (ref <18)

## 2021-04-26 MED ORDER — INSULIN ASPART 100 UNIT/ML IJ SOLN
10.0000 [IU] | Freq: Once | INTRAMUSCULAR | Status: AC
  Administered 2021-04-26: 10 [IU] via INTRAVENOUS
  Filled 2021-04-26: qty 1

## 2021-04-26 MED ORDER — METFORMIN HCL 500 MG PO TABS: 500.0000 mg | ORAL_TABLET | Freq: Two times a day (BID) | ORAL | 0 refills | Status: DC

## 2021-04-26 MED ORDER — METFORMIN HCL 500 MG PO TABS
500.0000 mg | ORAL_TABLET | Freq: Once | ORAL | Status: AC
  Administered 2021-04-26: 500 mg via ORAL
  Filled 2021-04-26: qty 1

## 2021-04-26 MED ORDER — LACTATED RINGERS IV BOLUS
1000.0000 mL | Freq: Once | INTRAVENOUS | Status: AC
  Administered 2021-04-26: 1000 mL via INTRAVENOUS

## 2021-04-26 MED ORDER — SODIUM CHLORIDE 0.9 % IV BOLUS
1000.0000 mL | Freq: Once | INTRAVENOUS | Status: AC
  Administered 2021-04-26: 1000 mL via INTRAVENOUS

## 2021-04-26 MED ORDER — BLOOD GLUCOSE MONITOR KIT: PACK | 0 refills | Status: DC

## 2021-04-26 NOTE — ED Provider Notes (Signed)
[facility] Emergency Department Provider Note ____________________________________________   Event Date/Time   First MD Initiated Contact with Patient 04/26/21 1505     (approximate)  I have reviewed the triage vital signs and the nursing notes.  HISTORY  Chief Complaint No chief complaint on file.   HPI Edward Harmon is a 65 y.o. malewho presents to the ED for evaluation of blurring vision and SOB.   Chart review indicates history of obesity, HTN, HLD and prostate cancer.  "Prediabetes" is noted.  No medications for diabetes. Currently receiving treatment for adenocarcinoma of the prostate including an interstitial implant currently in place.  Patient presents to the ED, accompanied by his wife, for evaluation of progressively worsening subacute-chronic blurry vision, generalized weakness, polyuria and polydipsia, and feeling of weakness, dizziness and shortness of breath with voiding.  He denies any dyspnea on exertion, reports dyspnea and feeling weak and dizzy all over after finishing a long void.  Denies abdominal pain or dysuria.  Reports progressively worsening blurry vision throughout his visual fields over the past few weeks.  Denies falls, injuries or trauma. Denies fever, syncope, chest pain, facial or ocular pain.  Reports in the beginning of September he had an injection of testosterone, and was warned that if he had any "borderline" problems that this will bring to the surface.  Past Medical History:  Diagnosis Date   Hyperlipidemia    Hypertension    Pre-diabetes    Prostate cancer [facility])     Patient Active Problem List   Diagnosis Date Noted   Prostate cancer ([place]) 10/25/2019   Elevated PSA 04/26/2019   Essential hypertension 04/26/2019   Hyperlipidemia, mixed 04/26/2019   Prediabetes 04/26/2019    Past Surgical History:  Procedure Laterality Date   APPENDECTOMY     COLONOSCOPY  2011   FRACTURE SURGERY Right    hand    RADIOACTIVE SEED IMPLANT N/A 03/28/2021   Procedure: RADIOACTIVE SEED IMPLANT/BRACHYTHERAPY IMPLANT;  Surgeon: Billey Co, MD;  Location: [facility] ORS;  Service: Urology;  Laterality: N/A;   VOLUME STUDY N/A 02/27/2021   Procedure: VOLUME STUDY;  Surgeon: Billey Co, MD;  Location: [facility] ORS;  Service: Urology;  Laterality: N/A;    Prior to Admission medications   Medication Sig Start Date End Date Taking? Authorizing Provider  amLODipine (NORVASC) 10 MG tablet Take 10 mg by mouth at bedtime. 03/29/19   [provider]  atorvastatin (LIPITOR) 40 MG tablet Take 40 mg by mouth at bedtime. 10/25/19   [provider]  Calcium Carbonate-Vit D-Min (CALCIUM 1200 PO) Take 1,200 mg by mouth daily.    [provider]  cholecalciferol (VITAMIN D3) 25 MCG (1000 UNIT) tablet Take 1,000 Units by mouth daily.    [provider]  mirabegron ER (MYRBETRIQ) 25 MG TB24 tablet Take 1 tablet (25 mg total) by mouth daily. 04/21/21   Noreene Filbert, MD  tadalafil (CIALIS) 10 MG tablet Take 1 tablet (10 mg total) by mouth every other day. Patient not taking: No sig reported 11/26/20   Billey Co, MD  tamsulosin (FLOMAX) 0.4 MG CAPS capsule Take 1 capsule (0.4 mg total) by mouth daily after supper. 03/28/21   Billey Co, MD    Allergies Patient has no known allergies.  Family History  Problem Relation Age of Onset   Prostate cancer Neg Hx     Social History Social History   Tobacco Use   Smoking status: Never   Smokeless tobacco: Never  Vaping Use   Vaping Use: Never used  Substance Use Topics   Alcohol use: No   Drug use: Never    Review of Systems  Constitutional: No fever/chills./Positive generalized weakness and dizziness with voiding. Eyes: Positive for blurry vision throughout his visual fields. ENT: No sore throat. Cardiovascular: Denies chest pain. Respiratory: Positive for shortness of breath. Gastrointestinal: No abdominal pain.  No  nausea, no vomiting.  No diarrhea.  No constipation. Genitourinary: Negative for dysuria. Musculoskeletal: Negative for back pain. Skin: Negative for rash. Neurological: Negative for headaches, focal weakness or numbness.   ____________________________________________   PHYSICAL EXAM:  VITAL SIGNS: Vitals:   04/26/21 1730 04/26/21 1800  BP: 132/84 (!) 135/94  Pulse: 86 93  Resp: 18 16  Temp:    SpO2: 98% 96%      Constitutional: Alert and oriented. Well appearing and in no acute distress. Eyes: Conjunctivae are normal. PERRL. EOMI. Head: Atraumatic. Nose: No congestion/rhinnorhea. Mouth/Throat: Mucous membranes are dry.  Oropharynx non-erythematous. Neck: No stridor. No cervical spine tenderness to palpation. Cardiovascular: Normal rate, regular rhythm. Grossly normal heart sounds.  Good peripheral circulation. Respiratory: Normal respiratory effort.  No retractions. Lungs CTAB. Gastrointestinal: Soft , nondistended, nontender to palpation. No CVA tenderness. Musculoskeletal: No lower extremity tenderness nor edema.  No joint effusions. No signs of acute trauma. Neurologic:  Normal speech and language. No gross focal neurologic deficits are appreciated. No gait instability noted. Cranial nerves II through XII intact 5/5 strength and sensation in all 4 extremities No visual field cuts noted. Skin:  Skin is warm, dry and intact. No rash noted. Psychiatric: Mood and affect are normal. Speech and behavior are normal.  ____________________________________________   LABS (all labs ordered are listed, but only abnormal results are displayed)  Labs Reviewed  BASIC METABOLIC PANEL - Abnormal; Notable for the following components:      Result Value   Sodium 129 (*)    Chloride 91 (*)    Glucose, Bld 674 (*)    BUN 24 (*)    Creatinine, Ser 1.56 (*)    GFR, Estimated 49 (*)    All other components within normal limits  CBC - Abnormal; Notable for the following components:    HCT 36.0 (*)    MCHC 36.4 (*)    All other components within normal limits  URINALYSIS, ROUTINE W REFLEX MICROSCOPIC - Abnormal; Notable for the following components:   Color, Urine STRAW (*)    APPearance CLEAR (*)    Glucose, UA >=500 (*)    All other components within normal limits  CBG MONITORING, ED - Abnormal; Notable for the following components:   Glucose-Capillary 542 (*)    All other components within normal limits  CBG MONITORING, ED - Abnormal; Notable for the following components:   Glucose-Capillary 381 (*)    All other components within normal limits  CBG MONITORING, ED - Abnormal; Notable for the following components:   Glucose-Capillary 308 (*)    All other components within normal limits  RESP PANEL BY RT-PCR (FLU A&B, COVID) ARPGX2  TROPONIN I (HIGH SENSITIVITY)   ____________________________________________  12 Lead EKG  Sinus tachycardia with a rate of 107 bpm.  Normal axis and intervals.  No evidence of acute ischemia. ____________________________________________  RADIOLOGY  ED MD interpretation:  CXR reviewed by me without evidence of acute cardiopulmonary pathology.  Official radiology report(s): DG Chest 2 View  Result Date: 04/26/2021 CLINICAL DATA:  Dyspnea on exertion. EXAM: CHEST - 2 VIEW COMPARISON:  April 25, 2006. FINDINGS: The heart size and mediastinal contours are within normal limits. Both lungs are clear. The visualized skeletal structures are unremarkable. IMPRESSION: No active cardiopulmonary disease. Electronically Signed   By: Marijo Conception M.D.   On: 04/26/2021 15:53    ____________________________________________   PROCEDURES and INTERVENTIONS  Procedure(s) performed (including Critical Care):  .1-3 Lead EKG Interpretation Performed by: Vladimir Crofts, MD Authorized by: Vladimir Crofts, MD     Interpretation: normal     ECG rate:  94   ECG rate assessment: normal     Rhythm: sinus rhythm     Ectopy: none     Conduction:  normal    Medications  sodium chloride 0.9 % bolus 1,000 mL (0 mLs Intravenous Stopped 04/26/21 1456)  insulin aspart (novoLOG) injection 10 Units (10 Units Intravenous Given 04/26/21 1556)  metFORMIN (GLUCOPHAGE) tablet 500 mg (500 mg Oral Given 04/26/21 1557)  lactated ringers bolus 1,000 mL (0 mLs Intravenous Stopped 04/26/21 1726)  insulin aspart (novoLOG) injection 10 Units (10 Units Intravenous Given 04/26/21 1819)    ____________________________________________   MDM / ED COURSE   65 year old male presents to the ED with progressively worsening subacute and chronic symptoms, with evidence of symptomatic hyperglycemia, ultimately amenable to outpatient management.  Tachycardic prior to fluid resuscitation, otherwise normal vitals on room air.  Exam without evidence of neurologic deficits.  Dry mucous membranes are noted prior to fluids.  Blood work with hyperglycemia without evidence of acidosis.  Doubt HHS.  He was clinically well and has improving blood glucose after fluids and IV insulin.  Started the patient on metformin.  No evidence of ACS, CAP, COVID or further derangements.  This is likely iatrogenic in the setting of his outpatient treatment for prostate cancer.  He has follow-up already with his PCP next week.  We will get him started on metformin, prescribe a glucometer, and have him follow-up.  Return precautions discussed.  Clinical Course as of 04/26/21 1902  Sat Apr 26, 2021  1801 Reassessed.  Patient reports feeling better.  Vision clearing.  Glucose improving.  We discussed 1 more dose of insulin and likely outpatient management thereafter.  He is agreement. [DS]  0211 Reassessed.  Glucose improving to 300.  Blurry vision has resolved and patient is feeling much better.  Requesting discharge.  We discussed management at home, metformin, glucometer and following with PCP.  Discussed return precautions.  We discussed symptoms to look out for for hypo and hyperglycemia. [DS]     Clinical Course User Index [DS] Vladimir Crofts, MD    ____________________________________________   FINAL CLINICAL IMPRESSION(S) / ED DIAGNOSES  Final diagnoses:  Hyperglycemia     ED Discharge Orders     None        Kymberli Wiegand Tamala Julian   Note:  This document was prepared using Dragon voice recognition software and may include unintentional dictation errors.    Vladimir Crofts, MD 04/26/21 [phone]

## 2021-04-26 NOTE — ED Notes (Signed)
Pt ambulated to BR with steady gait.

## 2021-04-26 NOTE — ED Triage Notes (Signed)
Patient has implant radiation for prostate cancer that he reports is continuous. Today c/o sob X1 week. Reports changing vision X1 week. Also c/o hot flashed since September.

## 2021-04-26 NOTE — ED Notes (Signed)
Pt has active radiation seed for prostate cancer-was told this may affect his sugar. Pt also states lack of appetite recently.

## 2021-04-29 DIAGNOSIS — E1165 Type 2 diabetes mellitus with hyperglycemia: Secondary | ICD-10-CM | POA: Insufficient documentation

## 2021-07-28 ENCOUNTER — Other Ambulatory Visit: Payer: Federal, State, Local not specified - PPO

## 2021-07-28 ENCOUNTER — Other Ambulatory Visit: Payer: Self-pay | Admitting: *Deleted

## 2021-07-28 ENCOUNTER — Encounter: Payer: Self-pay | Admitting: Urology

## 2021-07-28 DIAGNOSIS — C61 Malignant neoplasm of prostate: Secondary | ICD-10-CM

## 2021-07-30 ENCOUNTER — Ambulatory Visit: Payer: Federal, State, Local not specified - PPO | Admitting: Urology

## 2021-08-01 ENCOUNTER — Encounter: Payer: Self-pay | Admitting: Urology

## 2021-08-04 ENCOUNTER — Ambulatory Visit
Admission: RE | Admit: 2021-08-04 | Discharge: 2021-08-04 | Disposition: A | Discharge status: Home / Self Care | Payer: Federal, State, Local not specified - PPO | Source: Ambulatory Visit | Attending: Radiation Oncology | Admitting: Radiation Oncology

## 2021-08-04 ENCOUNTER — Other Ambulatory Visit: Payer: Self-pay

## 2021-08-04 ENCOUNTER — Encounter: Payer: Self-pay | Admitting: Radiation Oncology

## 2021-08-04 ENCOUNTER — Other Ambulatory Visit: Payer: Self-pay | Admitting: *Deleted

## 2021-08-04 ENCOUNTER — Inpatient Hospital Stay: Payer: Federal, State, Local not specified - PPO | Attending: Radiation Oncology

## 2021-08-04 VITALS — BP 139/88 | HR 106 | Temp 97.5°F | Resp 16 | Ht 69.0 in | Wt 222.9 lb

## 2021-08-04 DIAGNOSIS — C61 Malignant neoplasm of prostate: Secondary | ICD-10-CM

## 2021-08-04 DIAGNOSIS — Z923 Personal history of irradiation: Secondary | ICD-10-CM | POA: Diagnosis not present

## 2021-08-04 DIAGNOSIS — R351 Nocturia: Secondary | ICD-10-CM | POA: Diagnosis not present

## 2021-08-04 DIAGNOSIS — N529 Male erectile dysfunction, unspecified: Secondary | ICD-10-CM | POA: Insufficient documentation

## 2021-08-04 LAB — PSA: Prostatic Specific Antigen: 0.28 ng/mL (ref 0.00–4.00)

## 2021-08-04 NOTE — Progress Notes (Signed)
Radiation Oncology Follow up Note  Name: Edward Harmon   Date:   08/04/2021 MRN:  [id number] DOB: August 31, 1955    This 66 y.o. male presents to the clinic today for 82-month follow-up status post I-125 interstitial implant for Gleason 7 (3+4) adenocarcinoma the prostate presenting with a PSA of 15.  REFERRING PROVIDER: Wayland Denis, PA-C  HPI: Patient is a 66 year old male now about 4 months having completed I-125 interstitial implant for Gleason 7 adenocarcinoma presenting with a PSA in the 15 range.  He has nocturia x3.  Also does experience erectile dysfunction.  He is currently on Myrbetriq although continues to have some urgency and frequency.  He had his PSA performed today..  COMPLICATIONS OF TREATMENT: none  FOLLOW UP COMPLIANCE: keeps appointments   PHYSICAL EXAM:  BP 139/88 (BP Location: Left Arm, Patient Position: Sitting)    Pulse (!) 106    Temp (!) 97.5 F (36.4 C) (Tympanic)    Resp 16    Ht 5\' 9"  (1.753 m)    Wt 222 lb 14.4 oz (101.1 kg)    BMI 32.92 kg/m  Well-developed well-nourished patient in NAD. HEENT reveals PERLA, EOMI, discs not visualized.  Oral cavity is clear. No oral mucosal lesions are identified. Neck is clear without evidence of cervical or supraclavicular adenopathy. Lungs are clear to A&P. Cardiac examination is essentially unremarkable with regular rate and rhythm without murmur rub or thrill. Abdomen is benign with no organomegaly or masses noted. Motor sensory and DTR levels are equal and symmetric in the upper and lower extremities. Cranial nerves II through XII are grossly intact. Proprioception is intact. No peripheral adenopathy or edema is identified. No motor or sensory levels are noted. Crude visual fields are within normal range.  RADIOLOGY RESULTS: No current films for review  PLAN: Present time patient has some urinary symptoms which is being followed by urology.  We will review his PSA when that becomes available.  Have otherwise asked to see  him back in 6 months for follow-up.  He continues close follow-up care with urology.  I would like to take this opportunity to thank you for allowing me to participate in the care of your patient.Noreene Filbert, MD

## 2021-08-12 ENCOUNTER — Other Ambulatory Visit: Payer: Self-pay

## 2021-08-12 ENCOUNTER — Ambulatory Visit (INDEPENDENT_AMBULATORY_CARE_PROVIDER_SITE_OTHER): Payer: Federal, State, Local not specified - PPO | Admitting: Urology

## 2021-08-12 ENCOUNTER — Other Ambulatory Visit
Admission: RE | Admit: 2021-08-12 | Discharge: 2021-08-12 | Disposition: A | Discharge status: Home / Self Care | Payer: Federal, State, Local not specified - PPO | Attending: Urology | Admitting: Urology

## 2021-08-12 ENCOUNTER — Encounter: Payer: Self-pay | Admitting: Urology

## 2021-08-12 VITALS — BP 158/89 | HR 103 | Ht 69.0 in | Wt 221.0 lb

## 2021-08-12 DIAGNOSIS — C61 Malignant neoplasm of prostate: Secondary | ICD-10-CM | POA: Insufficient documentation

## 2021-08-12 LAB — URINALYSIS, COMPLETE (UACMP) WITH MICROSCOPIC
Bacteria, UA: NONE SEEN
Bilirubin Urine: NEGATIVE
Glucose, UA: NEGATIVE mg/dL
Hgb urine dipstick: NEGATIVE
Ketones, ur: NEGATIVE mg/dL
Leukocytes,Ua: NEGATIVE
Nitrite: NEGATIVE
Protein, ur: NEGATIVE mg/dL
Specific Gravity, Urine: 1.025 (ref 1.005–1.030)
pH: 5.5 (ref 5.0–8.0)

## 2021-08-12 NOTE — Progress Notes (Signed)
° °  08/12/2021 2:12 PM   Annye English Aug 05, 1955 [id number]  Reason for visit: Follow up prostate cancer, urinary symptoms  HPI: I saw Mr. Stene and his wife in clinic today for the above issues.  His wife provides the majority of the history today.  Briefly, he is a 66 year old male who was originally diagnosed with low risk prostate cancer and a prostate MRI in June 2021 showed a PI-RADS 4 lesion and I recommended fusion biopsy, but he was lost to follow-up.  PSA continued to rise and was 15.4, and he underwent a fusion biopsy in [place] in June 2022 that showed a 37 g prostate with favorable intermediate risk prostate cancer in the ROI.  He was taking Cialis at baseline 5 to 10 mg as needed for ED, and had a urinary symptoms of frequency during the day prior to undergoing any treatment for prostate cancer.  He was ultimately treated with 6 months of ADT(injection given 01/23/2021) and brachytherapy performed by Dr. Donella Stade myself on 03/28/2021.  It sounds like he had a very hard time after surgery with fatigue, hot flashes, weakness, dizziness.  I was not aware of these issues.  He was seen in the ER on 04/26/2021 and work-up revealed diabetes with blood sugar of 674 and hemoglobin A1c of 12.1.  Urinalysis was benign.  He has been checking his sugar since then and has been on glimepiride.  He reports that overall he is improved since that time.  He had one hot flash a few days ago but that was the first 1 he had had in a few weeks.  Dr. Baruch Gouty has been managing his medications regarding his urinary symptoms and discontinued Flomax and started Myrbetriq.  He had had some problems with urgency, frequency and urge incontinence, but those have also improved over the last few weeks.  He denies any dysuria or gross hematuria.  He denies any pain.  Suspect most of his symptoms over the last few months secondary to a combination of ADT and uncontrolled diabetes.  I recommended hemoglobin A1c today,  CBC/BMP, and urinalysis to rule out any other etiology.  PSA is fortunately decreased appropriately to 0.28.  We discussed the need to continue to monitor the PSA.  He is also had some trouble with erections, but would like to hold off on restarting Cialis until his overall health has improved.  -Discontinue Myrbetriq and Flomax, monitor urinary symptoms and consider trial of medication in the future if recurrence -Call with lab results from today -RTC 6 months PSA prior, consider retrial of Cialis 10 to 20 mg either scheduled or on demand for ED  I spent 35 total minutes on the day of the encounter including pre-visit review of the medical record, face-to-face time with the patient, and post visit ordering of labs/imaging/tests.  Billey Co, [facility] [address] [place], [place] [address] [phone]

## 2021-08-12 NOTE — Patient Instructions (Signed)
Hormone Suppression Therapy for Prostate Cancer Hormone suppression therapy is a treatment for prostate cancer that can help slow the growth of cancer cells in the prostate gland. It is also called androgen deprivation therapy (ADT) or androgen suppression therapy. Hormone suppression therapy targets male sex hormones (androgens) in the body that help cancer cells grow. Hormone suppression therapy alone will not cure prostate cancer, but it can slow the growth of cancer cells and may shrink tumors over time. Your health care provider can help you find the best treatment that fits your lifestyle. Hormone suppression therapy may be used in the following cases: When prostate cancer has spread too far to other places in the body and cannot be cured by surgery or radiation. When a person has health problems that prevent the use of surgery or radiation. Before radiation to help shrink the size of the cancer and make the radiation treatment more effective. If the prostate cancer remains or comes back following treatment with surgery or radiation. What are the types of hormone suppression therapy? Orchiectomy Orchiectomy, also called surgical castration, is a surgery to remove one or both testicles. The testicles make the two main androgens--testosterone and dihydrotestosterone (DHT). This surgery reduces the levels of testosterone in the blood, leading to decreased androgen production. Medicine therapy Medicine therapy, also called medical or chemical castration, involves taking medicines to keep your body from making or using androgens. Medicines can do this in one of three ways: Reducing androgen production by the testicles. Luteinizing hormone-releasing hormone [facility]) agonists. These medicines are injected or implanted under your skin to lower the amount of androgens that your testicles make. Depending on the medicine, they can be given monthly or up to every 3 to 6 months. If you take these medicines, you  may also be prescribed other medicines to help with side effects. LHRH antagonists. These medicines also work to lower the amount of androgens made in the testicles, but they work faster than [facility] agonist medicines and have less severe side effects. They are given as a monthly injection under the skin, and they are used when prostate cancer is in an advanced stage. Estrogens. These medicines are male hormones that help to reduce androgen production by the testicles. Estrogens are not used as commonly as other types of hormone suppression therapy due to their side effects. However, they may be used if other treatments do not work. Blocking androgen attachment throughout the body. Anti-androgen medicines, also called androgen receptor antagonists, block areas on the body where androgens attach. These are pills that are usually used in combination with other types of hormone suppression therapy, like orchiectomy and other medicines. Blocking androgen production throughout the body. Androgen synthesis inhibitor medicines. These medicines help to stop other areas of the body from making androgens. They are taken as pills. They may be used if the prostate cancer is advanced and has not gotten better with surgery or other medicines. A steroid medicine may be given with this type of medicine to help with side effects. What are the risks? Hormone suppression therapy may cause side effects, including: Hot flashes. Diarrhea and nausea. Itching. Sexual side effects, such as: Decrease or lack of sexual desire. Decrease in size of the penis or testicles. Inability to get an erection (erectile dysfunction, or impotence). Breast tenderness or increase in breast size. Fatigue. Weight gain. Anemia. Thinning of the bones (osteoporosis) and loss of muscle mass. Depression, mood swings, and trouble with thinking or focusing. Hormone suppression therapy may also increase your  risk of high blood pressure, increased  cholesterol levels, stroke, heart attack, or diabetes. What are the benefits? One of the main benefits of hormone suppression therapy is having additional treatment options. You may have only one type of treatment, or two or more types at the same time. Treatments may be combined to: Help with side effects. Treat advanced cancer. Where to find more information American Cancer Society: www.cancer.[place]: www.cancer.gov Contact a health care provider if: You have pain or side effects that do not get better with treatment. You have trouble urinating. You have new side effects that do not go away. Get help right away if: You have severe chest pain. You have trouble breathing. You have an irregular heartbeat. You have numbness or paralysis in the lower half of your body. You are confused. You have trouble talking or understanding speech. These symptoms may be an emergency. Do not wait to see if the symptoms will go away. Get medical help right away. Call your local emergency services (911 in the [place].S.). Do not drive yourself to the hospital. Summary Hormone suppression therapy is a treatment for prostate cancer that can help to slow the growth of cancer cells in the prostate gland. Hormone suppression therapy alone will not cure prostate cancer, but it can slow the growth of prostate cancer and may shrink tumors over time. Treatment to suppress hormones may include surgery or medicines. Side effects such as hot flashes, changes in sexual function or desire, and weakened bones can result from hormone suppression therapy. This information is not intended to replace advice given to you by your health care provider. Make sure you discuss any questions you have with your health care provider. Document Revised: 09/26/2020 Document Reviewed: 09/26/2020 Elsevier Patient Education  [place].

## 2021-08-13 ENCOUNTER — Ambulatory Visit
Admission: RE | Admit: 2021-08-13 | Discharge: 2021-08-13 | Disposition: A | Discharge status: Home / Self Care | Payer: Federal, State, Local not specified - PPO | Attending: Urology | Admitting: Urology

## 2021-08-13 DIAGNOSIS — E1165 Type 2 diabetes mellitus with hyperglycemia: Secondary | ICD-10-CM | POA: Diagnosis not present

## 2021-08-13 DIAGNOSIS — C61 Malignant neoplasm of prostate: Secondary | ICD-10-CM | POA: Insufficient documentation

## 2021-08-13 DIAGNOSIS — R350 Frequency of micturition: Secondary | ICD-10-CM | POA: Insufficient documentation

## 2021-08-13 LAB — CBC WITH DIFFERENTIAL/PLATELET
Abs Immature Granulocytes: 0.01 10*3/uL (ref 0.00–0.07)
Basophils Absolute: 0 10*3/uL (ref 0.0–0.1)
Basophils Relative: 1 %
Eosinophils Absolute: 0.2 10*3/uL (ref 0.0–0.5)
Eosinophils Relative: 3 %
HCT: 33.8 % — ABNORMAL LOW (ref 39.0–52.0)
Hemoglobin: 11.6 g/dL — ABNORMAL LOW (ref 13.0–17.0)
Immature Granulocytes: 0 %
Lymphocytes Relative: 26 %
Lymphs Abs: 1.6 10*3/uL (ref 0.7–4.0)
MCH: 29 pg (ref 26.0–34.0)
MCHC: 34.3 g/dL (ref 30.0–36.0)
MCV: 84.5 fL (ref 80.0–100.0)
Monocytes Absolute: 0.6 10*3/uL (ref 0.1–1.0)
Monocytes Relative: 11 %
Neutro Abs: 3.5 10*3/uL (ref 1.7–7.7)
Neutrophils Relative %: 59 %
Platelets: 219 10*3/uL (ref 150–400)
RBC: 4 MIL/uL — ABNORMAL LOW (ref 4.22–5.81)
RDW: 12 % (ref 11.5–15.5)
WBC: 5.9 10*3/uL (ref 4.0–10.5)
nRBC: 0 % (ref 0.0–0.2)

## 2021-08-13 LAB — HEMOGLOBIN A1C
Hgb A1c MFr Bld: 7.1 % — ABNORMAL HIGH (ref 4.8–5.6)
Mean Plasma Glucose: 157.07 mg/dL

## 2021-08-13 LAB — BASIC METABOLIC PANEL
Anion gap: 10 (ref 5–15)
BUN: 25 mg/dL — ABNORMAL HIGH (ref 8–23)
CO2: 28 mmol/L (ref 22–32)
Calcium: 9.3 mg/dL (ref 8.9–10.3)
Chloride: 99 mmol/L (ref 98–111)
Creatinine, Ser: 1.36 mg/dL — ABNORMAL HIGH (ref 0.61–1.24)
GFR, Estimated: 58 mL/min — ABNORMAL LOW (ref >60)
Glucose, Bld: 129 mg/dL — ABNORMAL HIGH (ref 70–99)
Potassium: 4.2 mmol/L (ref 3.5–5.1)
Sodium: 137 mmol/L (ref 135–145)

## 2021-08-14 ENCOUNTER — Telehealth: Payer: Self-pay

## 2021-08-14 LAB — URINE CULTURE: Culture: NO GROWTH

## 2021-08-14 NOTE — Telephone Encounter (Signed)
-----   Message from Billey Co, MD sent at 08/13/2021  3:54 PM EST ----- Doristine Devoid news, urinalysis benign, labs all improved significantly from prior ER visit in October.  Suspect his symptoms are from the ADT injection from July, and this should continue to wear off over the next few weeks.  Keep follow-up as scheduled  Nickolas Madrid, MD 08/13/2021

## 2021-08-14 NOTE — Telephone Encounter (Signed)
Called pt, call dropped. 1st attempt.

## 2021-08-15 NOTE — Telephone Encounter (Signed)
Called pt's wife informed her of the information below per DPR. Wife voiced understanding.

## 2021-09-02 ENCOUNTER — Ambulatory Visit (INDEPENDENT_AMBULATORY_CARE_PROVIDER_SITE_OTHER): Payer: Federal, State, Local not specified - PPO | Admitting: Urology

## 2021-09-02 ENCOUNTER — Other Ambulatory Visit: Payer: Self-pay

## 2021-09-02 DIAGNOSIS — C61 Malignant neoplasm of prostate: Secondary | ICD-10-CM

## 2021-09-02 NOTE — Progress Notes (Signed)
Virtual Visit via Telephone Note ? ?I connected with Edward Harmon on 09/02/21 at 11:30 AM EST by telephone and verified that I am speaking with the correct person using two identifiers. ?  ?Patient location: Home ?Provider location: [facility] ? ? ?I discussed the limitations, risks, security and privacy concerns of performing an evaluation and management service by telephone and the availability of in person appointments. We discussed the impact of the COVID-19 pandemic on the healthcare system, and the importance of social distancing and reducing patient and provider exposure. I also discussed with the patient that there may be a patient responsible charge related to this service. The patient expressed understanding and agreed to proceed. ? ?Reason for visit: ?Prostate cancer, urinary symptoms ? ?History of Present Illness: ?Briefly, 66 year old male who was diagnosed with intermediate risk prostate cancer and ultimately opted for 6 months of ADT(injection given 01/23/2021) and brachytherapy on 03/28/2021.  It sounds like he has had a pretty hard time with the ADT with hot flashes and fatigue, as well as exacerbation of his diabetes culminating in an ER visit at the end of October 2022 with blood sugar of 674 and hemoglobin A1c of 12.1.  He has improved since that time and most recent hemoglobin A1c was 7.1.  He still has some lightheadedness or weakness when he wakes up in the morning, but it sounds like this correlates with a low blood sugar in the 70s or 80s, and improves after eating breakfast.  Overall urinary symptoms seem to be stable with some occasional weak stream, and he is no longer taking Flomax or Myrbetriq.  We could consider resuming Flomax in the future if worsening urinary symptoms.  He can also resume Cialis when he is ready for ED, and he was on this prior to surgery.  PSA very low at 0.28. ? ? ?Follow Up: ?RTC as scheduled in August 2023 with PSA prior ?Discussed urinary  symptoms and ED at that visit ?  ?I discussed the assessment and treatment plan with the patient. The patient was provided an opportunity to ask questions and all were answered. The patient agreed with the plan and demonstrated an understanding of the instructions. ?  ?The patient was advised to call back or seek an in-person evaluation if the symptoms worsen or if the condition fails to improve as anticipated. ? ?I provided 7 minutes of non-face-to-face time during this encounter. ? ? ?Billey Co, MD  ? ?

## 2021-12-08 ENCOUNTER — Telehealth: Payer: Self-pay | Admitting: Urology

## 2021-12-08 NOTE — Telephone Encounter (Signed)
Called wife for more details, no answer. Unable to leave message as voicemail is not set up.   Please advise on length of side effects from [place].

## 2021-12-08 NOTE — Telephone Encounter (Signed)
Pt had shot in 9/22 and is still having hot flashes from this.  Pt's wife wants to know if he should be seen sooner.  Pt follow up is 8/23 in [place] w/[facility].

## 2021-12-09 NOTE — Telephone Encounter (Signed)
Called pt's wife informed her of information below. Pt states that she and patient are concerned because they were told previously that we have never seen a patient have side effects this long from [place]. They were told at the last visit that we may need to keep a closer eye on the patient which is why they are calling. They question if pt needs labs or additional screening. Please advise.

## 2021-12-09 NOTE — Telephone Encounter (Signed)
Side effects from injection should wear off within about 6 months from injection date.  Every patient is different, and some can have some persistent side effects for even a few months after, but would anticipate by August at next follow-up will be feeling better.  Keep follow-up as scheduled.  Nickolas Madrid, MD 12/09/2021

## 2021-12-10 NOTE — Telephone Encounter (Signed)
Called pt's wife informed her of the information below. Appt scheduled.  Labs ordered.

## 2021-12-15 ENCOUNTER — Other Ambulatory Visit: Payer: Federal, State, Local not specified - PPO

## 2021-12-15 ENCOUNTER — Other Ambulatory Visit: Payer: Self-pay

## 2021-12-15 DIAGNOSIS — C61 Malignant neoplasm of prostate: Secondary | ICD-10-CM

## 2021-12-15 DIAGNOSIS — N529 Male erectile dysfunction, unspecified: Secondary | ICD-10-CM

## 2021-12-15 NOTE — Progress Notes (Signed)
Error

## 2021-12-16 ENCOUNTER — Telehealth: Payer: Self-pay

## 2021-12-16 LAB — TESTOSTERONE: Testosterone: 239 ng/dL — ABNORMAL LOW (ref 264–916)

## 2021-12-16 LAB — PSA: Prostate Specific Ag, Serum: 1 ng/mL (ref 0.0–4.0)

## 2021-12-16 NOTE — Telephone Encounter (Signed)
-----   Message from Billey Co, MD sent at 12/16/2021  8:15 AM EDT ----- Labs look good.  PSA decreased appropriately to 1.0 after prostate cancer treatment, and testosterone is very mildly low, which is expected after recent treatment for prostate cancer and anticipate this will continue to improve over the next 3 months  Nickolas Madrid, MD 12/16/2021

## 2021-12-16 NOTE — Telephone Encounter (Signed)
Called pt's wife per DPR. No answer. Unable to leave message as no voicemail is set up. 1st attempt.

## 2021-12-29 ENCOUNTER — Encounter: Payer: Self-pay | Admitting: Urology

## 2021-12-29 ENCOUNTER — Ambulatory Visit: Payer: Federal, State, Local not specified - PPO | Admitting: Urology

## 2021-12-29 VITALS — BP 155/84 | HR 96 | Ht 69.0 in | Wt 234.5 lb

## 2021-12-29 DIAGNOSIS — Z8546 Personal history of malignant neoplasm of prostate: Secondary | ICD-10-CM | POA: Diagnosis not present

## 2021-12-29 DIAGNOSIS — N529 Male erectile dysfunction, unspecified: Secondary | ICD-10-CM | POA: Diagnosis not present

## 2021-12-29 DIAGNOSIS — C61 Malignant neoplasm of prostate: Secondary | ICD-10-CM

## 2021-12-29 NOTE — Progress Notes (Signed)
   12/29/2021 4:16 PM   Edward Harmon Jan 31, 1956 [id number]  Reason for visit: Follow up prostate cancer, urinary symptoms  HPI: He is a 66 year old male who was originally diagnosed with low risk prostate cancer and a prostate MRI in June 2021 showed a PI-RADS 4 lesion and I recommended fusion biopsy, but he was lost to follow-up.  PSA continued to rise and was 15.4, and he underwent a fusion biopsy in [place] in June 2022 that showed a 37 g prostate with favorable intermediate risk prostate cancer in the ROI.  He was taking Cialis at baseline 5 to 10 mg as needed for ED, and had a urinary symptoms of frequency during the day prior to undergoing any treatment for prostate cancer.  He was ultimately treated with 6 months of ADT(injection given 01/23/2021) and brachytherapy performed by Dr. Donella Stade myself on 03/28/2021.  It sounds like he had a very hard time after surgery with fatigue, hot flashes, weakness, dizziness.  I was not aware of these issues.  He was seen in the ER on 04/26/2021 and work-up revealed diabetes with blood sugar of 674 and hemoglobin A1c of 12.1.  Urinalysis was benign.  He has been checking his sugar since then and has been on glimepiride.  He is currently feeling much better over the last few weeks.  Really his only complaint now is some persistent fatigue when doing strenuous work in the yard.  He has no trouble walking and no chest pain.  He has not had any further problems with hot flashes.  He occasionally has some mild dysuria but this is intermittent, otherwise denies any significant urinary complaints.  Still having some problems with ED, but has not taken the Cialis regularly.  We discussed the max dose would be 20 mg/day.  PSA nadir after brachytherapy was 0.28 in February 2023, most recent PSA 12/15/2021 1.0.  We discussed the concept of a PSA bounce after brachytherapy and the need to continue to monitor the PSA.  Cialis 5 to 20 mg on demand for ED Consider  resuming Flomax in the future if persistent intermittent dysuria RTC 6 months PSA prior  Billey Co, MD  [facility] [address] [place], [place] [address] [phone]

## 2022-01-22 ENCOUNTER — Other Ambulatory Visit: Payer: Self-pay | Admitting: Urology

## 2022-01-22 DIAGNOSIS — N529 Male erectile dysfunction, unspecified: Secondary | ICD-10-CM

## 2022-02-02 ENCOUNTER — Other Ambulatory Visit: Payer: Federal, State, Local not specified - PPO

## 2022-02-05 ENCOUNTER — Inpatient Hospital Stay: Payer: Federal, State, Local not specified - PPO | Attending: Radiation Oncology

## 2022-02-05 DIAGNOSIS — C61 Malignant neoplasm of prostate: Secondary | ICD-10-CM | POA: Insufficient documentation

## 2022-02-05 LAB — PSA: Prostatic Specific Antigen: 1.03 ng/mL (ref 0.00–4.00)

## 2022-02-09 ENCOUNTER — Ambulatory Visit: Payer: Federal, State, Local not specified - PPO | Admitting: Radiation Oncology

## 2022-02-10 ENCOUNTER — Ambulatory Visit: Payer: Federal, State, Local not specified - PPO | Admitting: Urology

## 2022-02-12 ENCOUNTER — Ambulatory Visit
Admission: RE | Admit: 2022-02-12 | Payer: Federal, State, Local not specified - PPO | Source: Ambulatory Visit | Admitting: Radiation Oncology

## 2022-04-02 ENCOUNTER — Other Ambulatory Visit: Payer: Self-pay | Admitting: *Deleted

## 2022-04-02 ENCOUNTER — Ambulatory Visit
Admission: RE | Admit: 2022-04-02 | Discharge: 2022-04-02 | Disposition: A | Discharge status: Home / Self Care | Payer: Federal, State, Local not specified - PPO | Source: Ambulatory Visit | Attending: Radiation Oncology | Admitting: Radiation Oncology

## 2022-04-02 ENCOUNTER — Encounter: Payer: Self-pay | Admitting: *Deleted

## 2022-04-02 ENCOUNTER — Encounter: Payer: Self-pay | Admitting: Radiation Oncology

## 2022-04-02 VITALS — BP 159/91 | HR 90 | Temp 98.5°F | Resp 18 | Wt 235.0 lb

## 2022-04-02 DIAGNOSIS — N529 Male erectile dysfunction, unspecified: Secondary | ICD-10-CM | POA: Diagnosis not present

## 2022-04-02 DIAGNOSIS — Z923 Personal history of irradiation: Secondary | ICD-10-CM | POA: Insufficient documentation

## 2022-04-02 DIAGNOSIS — C61 Malignant neoplasm of prostate: Secondary | ICD-10-CM

## 2022-04-02 NOTE — Progress Notes (Signed)
Radiation Oncology Follow up Note  Name: Edward Harmon   Date:   04/02/2022 MRN:  [id number] DOB: 1955-12-21    This 66 y.o. male presents to the clinic today for 49-monthfollow-up status post I-125 interstitial implant for Gleason 7 (3+4) adenocarcinoma presenting with a PSA of 15.  REFERRING PROVIDER: CWayland Denis PA-C  HPI: Patient is a 66year old male now out 10 months having completed I-125 interstitial implant for Gleason 7 adenocarcinoma the prostate.  Seen today in routine follow-up he is doing fairly well.  Specifically denies any increased lower urinary tract symptoms diarrhea he is having erectile dysfunction has been given Cialis by urology which she is not taking on a regular basis.  His most recent PSA is 1.0 up from 0.28 back in February 23..Marland Kitchen He is slightly anemic is being scheduled for colonoscopy.  COMPLICATIONS OF TREATMENT: none  FOLLOW UP COMPLIANCE: keeps appointments   PHYSICAL EXAM:  BP (!) 159/91 (BP Location: Right Arm, Patient Position: Sitting, Cuff Size: Large)   Pulse 90   Temp 98.5 F (36.9 C) (Tympanic)   Resp 18   Wt 235 lb (106.6 kg)   BMI 34.70 kg/m  Well-developed well-nourished patient in NAD. HEENT reveals PERLA, EOMI, discs not visualized.  Oral cavity is clear. No oral mucosal lesions are identified. Neck is clear without evidence of cervical or supraclavicular adenopathy. Lungs are clear to A&P. Cardiac examination is essentially unremarkable with regular rate and rhythm without murmur rub or thrill. Abdomen is benign with no organomegaly or masses noted. Motor sensory and DTR levels are equal and symmetric in the upper and lower extremities. Cranial nerves II through XII are grossly intact. Proprioception is intact. No peripheral adenopathy or edema is identified. No motor or sensory levels are noted. Crude visual fields are within normal range.  RADIOLOGY RESULTS: No current films for review  PLAN: Present time patient had a slight bump  in his PSA which is not unusual for interstitial implant.  I have asked to see him back in 6 months with a follow-up PSA.  I have also encouraged him to have a colonoscopy for his anemia.  Patient knows to call with any concerns.  I have also asked him to start taking his Cialis twice a week to try to his strength and his ability to have an erection.  Should that not help he will be reevaluated by urology for ED.  I would like to take this opportunity to thank you for allowing me to participate in the care of your patient..Noreene Filbert MD

## 2022-06-21 ENCOUNTER — Other Ambulatory Visit: Payer: Self-pay | Admitting: Radiation Oncology

## 2022-06-26 ENCOUNTER — Other Ambulatory Visit: Payer: Federal, State, Local not specified - PPO

## 2022-06-26 DIAGNOSIS — C61 Malignant neoplasm of prostate: Secondary | ICD-10-CM

## 2022-06-27 LAB — PSA: Prostate Specific Ag, Serum: 1.1 ng/mL (ref 0.0–4.0)

## 2022-07-02 ENCOUNTER — Encounter: Payer: Self-pay | Admitting: Urology

## 2022-07-02 ENCOUNTER — Ambulatory Visit: Payer: Federal, State, Local not specified - PPO | Admitting: Urology

## 2022-07-02 VITALS — BP 169/84 | HR 92 | Ht 69.0 in | Wt 234.9 lb

## 2022-07-02 DIAGNOSIS — C61 Malignant neoplasm of prostate: Secondary | ICD-10-CM

## 2022-07-02 DIAGNOSIS — N529 Male erectile dysfunction, unspecified: Secondary | ICD-10-CM

## 2022-07-02 MED ORDER — TADALAFIL 20 MG PO TABS: 20.0000 mg | ORAL_TABLET | ORAL | 6 refills | Status: DC

## 2022-07-02 NOTE — Progress Notes (Signed)
   07/02/2022 2:42 PM   Edward Harmon 03/28/1956 [id number]  Reason for visit: Follow up prostate cancer, erectile dysfunction, urinary symptoms  HPI: He is a 67 year old male who was originally diagnosed with low risk prostate cancer and a prostate MRI in June 2021 showed a PI-RADS 4 lesion and I recommended fusion biopsy, but he was lost to follow-up.  PSA continued to rise and was 15.4, and he underwent a fusion biopsy in [place] in June 2022 that showed a 37 g prostate with favorable intermediate risk prostate cancer in the ROI.   He was taking Cialis at baseline 5 to 10 mg as needed for ED, and had a urinary symptoms of frequency during the day prior to undergoing any treatment for prostate cancer.  He was ultimately treated with 6 months of ADT(injection given 01/23/2021) and brachytherapy performed by Dr. Donella Stade and myself on 03/28/2021.  It sounds like he had a very hard time after surgery with fatigue, hot flashes, weakness, dizziness.  I was not aware of these issues.  He was seen in the ER on 04/26/2021 and work-up revealed uncontrolled diabetes with blood sugar of 674 and hemoglobin A1c of 12.1.  Urinalysis was benign.  He has been checking his sugar since then and has been on glimepiride.  Most recent hemoglobin A1c from November 2023 had improved to 6.1.  He has been doing well over the last 6 months.  He has very minimal dysuria occasionally in the morning but this resolves during the day.  Otherwise denies any urinary symptoms.  He continues to have problems with erections despite Cialis 10 mg every other day.  I recommended increasing this to 20 mg every other day.  We also discussed alternative of using sildenafil 100 mg on demand.  He would like to start with the 20 mg Cialis every other day.  PSA nadir after brachytherapy was 0.28 in February 2023, most recent PSA 06/26/2022 was 1.1, which which was stable from 1.0 in June 2023.  We discussed the concept of a PSA bounce after  brachytherapy and the need to continue to monitor the PSA.  Cialis 20 mg every other day for ED, okay to try sildenafil 100 mg as needed if patient desires in the future RTC 6 months PSA prior  Billey Co, MD  [facility] [address] [place], [place] [address] [phone]

## 2022-10-01 ENCOUNTER — Inpatient Hospital Stay: Payer: Federal, State, Local not specified - PPO

## 2022-10-02 ENCOUNTER — Inpatient Hospital Stay: Payer: Federal, State, Local not specified - PPO | Attending: Radiation Oncology

## 2022-10-02 DIAGNOSIS — C61 Malignant neoplasm of prostate: Secondary | ICD-10-CM | POA: Diagnosis present

## 2022-10-02 LAB — PSA: Prostatic Specific Antigen: 1.13 ng/mL (ref 0.00–4.00)

## 2022-10-15 ENCOUNTER — Ambulatory Visit
Admission: RE | Admit: 2022-10-15 | Discharge: 2022-10-15 | Disposition: A | Discharge status: Home / Self Care | Payer: Federal, State, Local not specified - PPO | Source: Ambulatory Visit | Attending: Radiation Oncology | Admitting: Radiation Oncology

## 2022-10-15 ENCOUNTER — Other Ambulatory Visit: Payer: Self-pay | Admitting: *Deleted

## 2022-10-15 ENCOUNTER — Encounter: Payer: Self-pay | Admitting: Radiation Oncology

## 2022-10-15 VITALS — BP 159/92 | HR 75 | Temp 97.5°F | Resp 16 | Ht 69.5 in | Wt 231.0 lb

## 2022-10-15 DIAGNOSIS — C61 Malignant neoplasm of prostate: Secondary | ICD-10-CM

## 2022-10-15 DIAGNOSIS — Z923 Personal history of irradiation: Secondary | ICD-10-CM | POA: Diagnosis not present

## 2022-10-15 NOTE — Progress Notes (Signed)
Radiation Oncology Follow up Note  Name: Edward Harmon   Date:   10/15/2022 MRN:  [id number] DOB: 1955/08/06    This 67 y.o. male presents to the clinic today for 34-month follow-up status post I-125 interstitial implant for Gleason 7 (3+4) adenocarcinoma presenting with a PSA of 15.  REFERRING PROVIDER: Carren Rang, PA-C  HPI: Patient is a 67 year old male now out 16 months having completed I-125 interstitial implant for Gleason 7 adenocarcinoma presenting with a PSA of 15 seen today in routine follow-up clinically he is doing well.  Specifically denies any increased lower urinary tract symptoms diarrhea or fatigue he does have erectile dysfunction unresponsive to Cialis.Marland Kitchen  He is being seen by urology.  His PSA is 1.1 which is almost exactly stable from 823.  COMPLICATIONS OF TREATMENT: none  FOLLOW UP COMPLIANCE: keeps appointments   PHYSICAL EXAM:  BP (!) 159/92   Pulse 75   Temp (!) 97.5 F (36.4 C) (Tympanic)   Resp 16   Ht 5' 9.5" (1.765 m) Comment: stated HT  Wt 231 lb (104.8 kg)   BMI 33.62 kg/m  Well-developed well-nourished patient in NAD. HEENT reveals PERLA, EOMI, discs not visualized.  Oral cavity is clear. No oral mucosal lesions are identified. Neck is clear without evidence of cervical or supraclavicular adenopathy. Lungs are clear to A&P. Cardiac examination is essentially unremarkable with regular rate and rhythm without murmur rub or thrill. Abdomen is benign with no organomegaly or masses noted. Motor sensory and DTR levels are equal and symmetric in the upper and lower extremities. Cranial nerves II through XII are grossly intact. Proprioception is intact. No peripheral adenopathy or edema is identified. No motor or sensory levels are noted. Crude visual fields are within normal range.  RADIOLOGY RESULTS: No current films for review  PLAN: Present time patient is doing well stable PSA over time.  He continues with Cialis although that has been affected  recently by his diagnosis of adult onset diabetes.  That is being managed by urology.  I will see him back in 6 months for follow-up.  Will try to alternate every 6 months with urology.  Patient is to call with any concerns.  I would like to take this opportunity to thank you for allowing me to participate in the care of your patient.Carmina Miller, MD

## 2022-11-06 ENCOUNTER — Emergency Department
Admission: EM | Admit: 2022-11-06 | Discharge: 2022-11-06 | Disposition: A | Discharge status: Home / Self Care | Payer: Federal, State, Local not specified - PPO | Attending: Emergency Medicine | Admitting: Emergency Medicine

## 2022-11-06 ENCOUNTER — Encounter: Payer: Self-pay | Admitting: Emergency Medicine

## 2022-11-06 DIAGNOSIS — I10 Essential (primary) hypertension: Secondary | ICD-10-CM | POA: Diagnosis not present

## 2022-11-06 DIAGNOSIS — R300 Dysuria: Secondary | ICD-10-CM | POA: Insufficient documentation

## 2022-11-06 DIAGNOSIS — N3090 Cystitis, unspecified without hematuria: Secondary | ICD-10-CM | POA: Insufficient documentation

## 2022-11-06 DIAGNOSIS — Z8546 Personal history of malignant neoplasm of prostate: Secondary | ICD-10-CM | POA: Insufficient documentation

## 2022-11-06 DIAGNOSIS — E119 Type 2 diabetes mellitus without complications: Secondary | ICD-10-CM | POA: Diagnosis not present

## 2022-11-06 LAB — COMPREHENSIVE METABOLIC PANEL
ALT: 26 U/L (ref 0–44)
AST: 29 U/L (ref 15–41)
Albumin: 4.4 g/dL (ref 3.5–5.0)
Alkaline Phosphatase: 55 U/L (ref 38–126)
Anion gap: 11 (ref 5–15)
BUN: 19 mg/dL (ref 8–23)
CO2: 25 mmol/L (ref 22–32)
Calcium: 9 mg/dL (ref 8.9–10.3)
Chloride: 105 mmol/L (ref 98–111)
Creatinine, Ser: 1.25 mg/dL — ABNORMAL HIGH (ref 0.61–1.24)
GFR, Estimated: >60 mL/min (ref >60)
Glucose, Bld: 106 mg/dL — ABNORMAL HIGH (ref 70–99)
Potassium: 3.6 mmol/L (ref 3.5–5.1)
Sodium: 141 mmol/L (ref 135–145)
Total Bilirubin: 0.8 mg/dL (ref 0.3–1.2)
Total Protein: 7.8 g/dL (ref 6.5–8.1)

## 2022-11-06 LAB — CBC WITH DIFFERENTIAL/PLATELET
Abs Immature Granulocytes: 0.03 10*3/uL (ref 0.00–0.07)
Basophils Absolute: 0 10*3/uL (ref 0.0–0.1)
Basophils Relative: 0 %
Eosinophils Absolute: 0.1 10*3/uL (ref 0.0–0.5)
Eosinophils Relative: 1 %
HCT: 40.5 % (ref 39.0–52.0)
Hemoglobin: 13.6 g/dL (ref 13.0–17.0)
Immature Granulocytes: 1 %
Lymphocytes Relative: 29 %
Lymphs Abs: 1.7 10*3/uL (ref 0.7–4.0)
MCH: 28.3 pg (ref 26.0–34.0)
MCHC: 33.6 g/dL (ref 30.0–36.0)
MCV: 84.2 fL (ref 80.0–100.0)
Monocytes Absolute: 0.5 10*3/uL (ref 0.1–1.0)
Monocytes Relative: 9 %
Neutro Abs: 3.6 10*3/uL (ref 1.7–7.7)
Neutrophils Relative %: 60 %
Platelets: 229 10*3/uL (ref 150–400)
RBC: 4.81 MIL/uL (ref 4.22–5.81)
RDW: 12.9 % (ref 11.5–15.5)
WBC: 5.9 10*3/uL (ref 4.0–10.5)
nRBC: 0 % (ref 0.0–0.2)

## 2022-11-06 LAB — URINALYSIS, ROUTINE W REFLEX MICROSCOPIC
Bilirubin Urine: NEGATIVE
Glucose, UA: NEGATIVE mg/dL
Hgb urine dipstick: NEGATIVE
Ketones, ur: NEGATIVE mg/dL
Leukocytes,Ua: NEGATIVE
Nitrite: NEGATIVE
Protein, ur: 30 mg/dL — AB
Specific Gravity, Urine: 1.014 (ref 1.005–1.030)
pH: 6 (ref 5.0–8.0)

## 2022-11-06 MED ORDER — SULFAMETHOXAZOLE-TRIMETHOPRIM 800-160 MG PO TABS: 1.0000 | ORAL_TABLET | Freq: Two times a day (BID) | ORAL | 0 refills | Status: DC

## 2022-11-06 MED ORDER — SULFAMETHOXAZOLE-TRIMETHOPRIM 800-160 MG PO TABS
1.0000 | ORAL_TABLET | Freq: Once | ORAL | Status: AC
  Administered 2022-11-06: 1 via ORAL
  Filled 2022-11-06: qty 1

## 2022-11-06 NOTE — ED Triage Notes (Signed)
Pt presents ambulatory to triage via POV with complaints of burning with urination - Hx of prostate cancer and has radiation beads in place. He notes for the last two weeks has has had some discomfort both before and after using the restroom. He has tried OTC medications without any improvement. A&Ox4 at this time. Denies  fevers, chills, N/V/D, hematuria, CP or SOB.

## 2022-11-06 NOTE — Discharge Instructions (Signed)
Take antibiotic as directed.  Your urine culture is pending at this time.  Follow-up with your urologist or your PCP for further evaluation management.

## 2022-11-06 NOTE — ED Provider Notes (Signed)
[facility] Emergency Department Provider Note     Event Date/Time   First MD Initiated Contact with Patient 11/06/22 2051     (approximate)   History   Dysuria   HPI  Edward Harmon is a 67 y.o. male with a history of HTN, HLD, DM, prostate cancer with radioactive seed placement, presents to the ED for dysuria.  Patient reports intermittent symptoms of burning with urination for the last 2 weeks.  He has had both pre and postvoid discomfort.  He has taken over-the-counter medication without significant benefit.  Denies any fevers, chills, nausea, vomiting, diarrhea, hematuria, or urinary retention.  Also denies any chest pain, flank pain, abdominal pain, or shortness of breath.  Physical Exam   Triage Vital Signs: ED Triage Vitals  Enc Vitals Group     BP 11/06/22 2007 (!) 173/100     Pulse Rate 11/06/22 2007 95     Resp 11/06/22 2007 18     Temp 11/06/22 2007 98.4 F (36.9 C)     Temp src --      SpO2 11/06/22 2007 100 %     Weight 11/06/22 2006 237 lb (107.5 kg)     Height 11/06/22 2006 5' 9.5" (1.765 m)     Head Circumference --      Peak Flow --      Pain Score 11/06/22 2005 4     Pain Loc --      Pain Edu? --      Excl. in GC? --     Most recent vital signs: Vitals:   11/06/22 2007 11/06/22 2224  BP: (!) 173/100 (!) 152/84  Pulse: 95   Resp: 18 16  Temp: 98.4 F (36.9 C)   SpO2: 100% 100%    General Awake, no distress.  CV:  Good peripheral perfusion.  RESP:  Normal effort.  ABD:  No distention.  No CVA tenderness elicited   ED Results / Procedures / Treatments   Labs (all labs ordered are listed, but only abnormal results are displayed) Labs Reviewed  COMPREHENSIVE METABOLIC PANEL - Abnormal; Notable for the following components:      Result Value   Glucose, Bld 106 (*)    Creatinine, Ser 1.25 (*)    All other components within normal limits  URINALYSIS, ROUTINE W REFLEX MICROSCOPIC - Abnormal; Notable for the  following components:   Color, Urine YELLOW (*)    APPearance CLEAR (*)    Protein, ur 30 (*)    Bacteria, UA MANY (*)    All other components within normal limits  URINE CULTURE  CBC WITH DIFFERENTIAL/PLATELET     EKG   RADIOLOGY  ED Provider Interpretation:   No results found.   PROCEDURES:  Critical Care performed: No  Procedures   MEDICATIONS ORDERED IN ED: Medications  sulfamethoxazole-trimethoprim (BACTRIM DS) 800-160 MG per tablet 1 tablet (1 tablet Oral Given 11/06/22 2223)     IMPRESSION / MDM / ASSESSMENT AND PLAN / ED COURSE  I reviewed the triage vital signs and the nursing notes.                              Differential diagnosis includes, but is not limited to, UTI, urinary retention, BPH, prostatitis, NGU  Patient's presentation is most consistent with acute complicated illness / injury requiring diagnostic workup.  Patient's diagnosis is consistent with cystitis. Patient will be discharged home with prescriptions  for Bactrim.  Urine culture is pending at this time patient is to follow up with urology as needed or otherwise directed. Patient is given ED precautions to return to the ED for any worsening or new symptoms.  FINAL CLINICAL IMPRESSION(S) / ED DIAGNOSES   Final diagnoses:  Dysuria  Cystitis     Rx / DC Orders   ED Discharge Orders          Ordered    sulfamethoxazole-trimethoprim (BACTRIM DS) 800-160 MG tablet  2 times daily        11/06/22 2216             Note:  This document was prepared using Dragon voice recognition software and may include unintentional dictation errors.    Lissa Hoard, PA-C 11/06/22 2349    Jene Every, MD 11/09/22 [phone]

## 2022-11-08 LAB — URINE CULTURE
Culture: NO GROWTH
Special Requests: NORMAL

## 2022-11-09 ENCOUNTER — Telehealth: Payer: Self-pay | Admitting: Urology

## 2022-11-09 DIAGNOSIS — N529 Male erectile dysfunction, unspecified: Secondary | ICD-10-CM

## 2022-11-09 NOTE — Telephone Encounter (Signed)
Pt was seen in ER on 5/10.  They were told to call office to make sure pt was on the correct antibiotic (Bactrim) for his urine culture. His wife Edward Harmon called.

## 2022-11-10 NOTE — Telephone Encounter (Signed)
Called pt's wife no answer, unable to leave message as voicemail is not set up. 1st attempt.

## 2022-11-10 NOTE — Telephone Encounter (Signed)
Appears urine cx is negative, do you want pt to have follow up?

## 2022-11-11 NOTE — Telephone Encounter (Signed)
Called pt wife, no answer. Unable to leave message as voicemail is not set up. 2nd attempt.

## 2022-11-27 MED ORDER — SILDENAFIL CITRATE 100 MG PO TABS
100.0000 mg | ORAL_TABLET | Freq: Every day | ORAL | 0 refills | Status: DC | PRN
Start: 2022-11-27 — End: 2023-03-23

## 2022-11-27 NOTE — Telephone Encounter (Signed)
Patient wife called back in, advised of negative results and to keep follow up as scheduled. States that patient is now experiencing burning and "pee spraying everywhere when he pees". She said she is concerned he may have an infection and wanted to know if he could be sooner. I did look for a sooner appt without success. She asked if one of Dr. Kathlee Nations CMAs could give her a call. Advised I would send a message back. [phone]-- Tammy

## 2022-11-27 NOTE — Addendum Note (Signed)
Addended by: Frankey Shown on: 11/27/2022 11:25 AM   Modules accepted: Orders

## 2022-11-27 NOTE — Telephone Encounter (Signed)
Called pt's wife per DPR, wife answers and the patient joined the call as well. The patient states that he completed antibiotics he received from the ED, however he is still having some burning at the end of urinary stream and complains that urinary stream is "spraying everywhere". The patient denies fever, chills, flank pain, urinary frequency or urgency. Urine culture from 11/06/22 negative. Patient has taken AZO with relief of sx. The patient also states that he has been looking inside of his penis for discharge with a flashlight and has been stretching the meatus of his penis with household objects for a "better look" inside. He states he has been goggling possible causes of his symptoms.   Advised pt on the risk of injury or infection from putting various objects into his penis and to stop immediately. Pt voiced understanding. Advised pt to increase water intake, reduce caffeine intake, and limit AZO to 3 days. Pt voiced understanding. Patient given ED precautions. Advised pt to keep upcoming appointment.   Patient states tadalafil is not working, RX for sildenafil sent in per Dr. Keane Scrape last note.

## 2022-12-03 ENCOUNTER — Other Ambulatory Visit: Payer: Self-pay

## 2022-12-03 ENCOUNTER — Other Ambulatory Visit: Payer: Federal, State, Local not specified - PPO

## 2022-12-03 DIAGNOSIS — C61 Malignant neoplasm of prostate: Secondary | ICD-10-CM

## 2022-12-04 LAB — PSA: Prostate Specific Ag, Serum: 1.9 ng/mL (ref 0.0–4.0)

## 2022-12-09 ENCOUNTER — Encounter: Payer: Self-pay | Admitting: Gastroenterology

## 2022-12-10 ENCOUNTER — Ambulatory Visit: Payer: Federal, State, Local not specified - PPO | Admitting: Urology

## 2022-12-10 ENCOUNTER — Encounter: Payer: Self-pay | Admitting: Urology

## 2022-12-10 VITALS — BP 151/75 | HR 90 | Ht 69.0 in | Wt 232.1 lb

## 2022-12-10 DIAGNOSIS — C61 Malignant neoplasm of prostate: Secondary | ICD-10-CM | POA: Diagnosis not present

## 2022-12-10 DIAGNOSIS — N529 Male erectile dysfunction, unspecified: Secondary | ICD-10-CM | POA: Diagnosis not present

## 2022-12-10 NOTE — Patient Instructions (Signed)
Erectile Dysfunction Erectile dysfunction (ED) is the inability to get or keep an erection in order to have sexual intercourse. ED is considered a symptom of an underlying disorder and is not considered a disease. ED may include: Inability to get an erection. Lack of enough hardness of the erection to allow penetration. Loss of erection before sex is finished. What are the causes? This condition may be caused by: Physical causes, such as: Artery problems. This may include heart disease, high blood pressure, atherosclerosis, and diabetes. Hormonal problems, such as low testosterone. Obesity. Nerve problems. This may include back or pelvic injuries, multiple sclerosis, Parkinson's disease, spinal cord injury, and stroke. Certain medicines, such as: Pain relievers. Antidepressants. Blood pressure medicines and water pills (diuretics). Cancer medicines. Antihistamines. Muscle relaxants. Lifestyle factors, such as: Use of drugs such as marijuana, cocaine, or opioids. Excessive use of alcohol. Smoking. Lack of physical activity or exercise. Psychological causes, such as: Anxiety or stress. Sadness or depression. Exhaustion. Fear about sexual performance. Guilt. What are the signs or symptoms? Symptoms of this condition include: Inability to get an erection. Lack of enough hardness of the erection to allow penetration. Loss of the erection before sex is finished. Sometimes having normal erections, but with frequent unsatisfactory episodes. Low sexual satisfaction in either partner due to erection problems. A curved penis occurring with erection. The curve may cause pain, or the penis may be too curved to allow for intercourse. Never having nighttime or morning erections. How is this diagnosed? This condition is often diagnosed by: Performing a physical exam to find other diseases or specific problems with the penis. Asking you detailed questions about the problem. Doing tests,  such as: Blood tests to check for diabetes mellitus or high cholesterol, or to measure hormone levels. Other tests to check for underlying health conditions. An ultrasound exam to check for scarring. A test to check blood flow to the penis. Doing a sleep study at home to measure nighttime erections. How is this treated? This condition may be treated by: Medicines, such as: Medicine taken by mouth to help you achieve an erection (oral medicine). Hormone replacement therapy to replace low testosterone levels. Medicine that is injected into the penis. Your health care provider may instruct you how to give yourself these injections at home. Medicine that is delivered with a short applicator tube. The tube is inserted into the opening at the tip of the penis, which is the opening of the urethra. A tiny pellet of medicine is put in the urethra. The pellet dissolves and enhances erectile function. This is also called MUSE (medicated urethral system for erections) therapy. Vacuum pump. This is a pump with a ring on it. The pump and ring are placed on the penis and used to create pressure that helps the penis become erect. Penile implant surgery. In this procedure, you may receive: An inflatable implant. This consists of cylinders, a pump, and a reservoir. The cylinders can be inflated with a fluid that helps to create an erection, and they can be deflated after intercourse. A semi-rigid implant. This consists of two silicone rubber rods. The rods provide some rigidity. They are also flexible, so the penis can both curve downward in its normal position and become straight for sexual intercourse. Blood vessel surgery to improve blood flow to the penis. During this procedure, a blood vessel from a different part of the body is placed into the penis to allow blood to flow around (bypass) damaged or blocked blood vessels. Lifestyle changes,  such as exercising more, losing weight, and quitting smoking. Follow  these instructions at home: Medicines  Take over-the-counter and prescription medicines only as told by your health care provider. Do not increase the dosage without first discussing it with your health care provider. If you are using self-injections, do injections as directed by your health care provider. Make sure you avoid any veins that are on the surface of the penis. After giving an injection, apply pressure to the injection site for 5 minutes. Talk to your health care provider about how to prevent headaches while taking ED medicines. These medicines may cause a sudden headache due to the increase in blood flow in your body. General instructions Exercise regularly, as directed by your health care provider. Work with your health care provider to lose weight, if needed. Do not use any products that contain nicotine or tobacco. These products include cigarettes, chewing tobacco, and vaping devices, such as e-cigarettes. If you need help quitting, ask your health care provider. Before using a vacuum pump, read the instructions that come with the pump and discuss any questions with your health care provider. Keep all follow-up visits. This is important. Contact a health care provider if: You feel nauseous. You are vomiting. You get sudden headaches while taking ED medicines. You have any concerns about your sexual health. Get help right away if: You are taking oral or injectable medicines and you have an erection that lasts longer than 4 hours. If your health care provider is unavailable, go to the nearest emergency room for evaluation. An erection that lasts much longer than 4 hours can result in permanent damage to your penis. You have severe pain in your groin or abdomen. You develop redness or severe swelling of your penis. You have redness spreading at your groin or lower abdomen. You are unable to urinate. You experience chest pain or a rapid heartbeat (palpitations) after taking oral  medicines. These symptoms may represent a serious problem that is an emergency. Do not wait to see if the symptoms will go away. Get medical help right away. Call your local emergency services (911 in the [place] Do not drive yourself to the hospital. Summary Erectile dysfunction (ED) is the inability to get or keep an erection during sexual intercourse. This condition is diagnosed based on a physical exam, your symptoms, and tests to determine the cause. Treatment varies depending on the cause and may include medicines, hormone therapy, surgery, or a vacuum pump. You may need follow-up visits to make sure that you are using your medicines or devices correctly. Get help right away if you are taking or injecting medicines and you have an erection that lasts longer than 4 hours. This information is not intended to replace advice given to you by your health care provider. Make sure you discuss any questions you have with your health care provider. Document Revised: 09/11/2020 Document Reviewed: 09/11/2020 Elsevier Patient Education  2024 [employer] Inc.  The Benefits of a Plant-Based Diet for Urology Health  A plant-based diet emphasizes the consumption of whole, unprocessed plant foods while minimizing or excluding animal products including meat and dairy products. This dietary approach has gained attention for its potential to promote overall health, including urology-related conditions. Incorporating a plant-based diet into your lifestyle can offer numerous benefits for maintaining optimal urology health.  1. Reduced Risk of Kidney Stones: A plant-based diet is typically rich in fruits, vegetables, legumes, and whole grains. These foods are high in dietary fiber, potassium, and magnesium, which  can help reduce the risk of developing kidney stones. Be careful to avoid high quantities of spinach, as these can contribute to kidney stone formation if eaten in large volumes. The increased intake of  water-soluble fiber can enhance the excretion of waste products and prevent the crystallization of minerals that lead to stone formation.  2. Improved Prostate Health: Studies have suggested a link between the consumption of red and processed meats and an increased risk of prostate problems, including benign prostatic hyperplasia (BPH) and prostate cancer. By adopting a plant-based diet, you can lower your intake of saturated fats and decrease the risk of these conditions. PSA levels can often decrease on plant based diets! Plant foods are also rich in antioxidants and phytochemicals that have been associated with prostate health.  3. Better Bladder Function: A diet focused on plant-based foods can contribute to better bladder health by reducing the risk of urinary tract infections (UTIs). Berries, citrus fruits, and leafy greens are known for their high vitamin C content, which can acidify urine and create an environment less favorable for bacteria growth. Additionally, plant-based diets are generally lower in sodium, which can help prevent fluid retention and reduce the strain on the bladder.  4. Management of Erectile Dysfunction (ED): Some research suggests that a plant-based diet can positively impact erectile function. Plant-based diets are associated with improved cardiovascular health, which is crucial for maintaining healthy blood flow and nerve function required for proper erectile function. By reducing the consumption of high-cholesterol and high-saturated fat animal products, a plant-based diet may contribute to a decreased risk of ED.  5. Prevention of Chronic Conditions: A plant-based diet can help prevent or manage chronic conditions such as obesity, diabetes, and hypertension. These conditions can contribute to urology-related issues, including urinary incontinence and kidney dysfunction. By maintaining a healthy weight and managing these conditions, you can reduce the risk of  urology-related complications.  Conclusion: Embracing a plant-based diet can offer significant benefits for urology health. By incorporating a variety of colorful fruits, vegetables, whole grains, nuts, seeds, and legumes into your meals, you can support kidney health, prostate health, bladder function, and overall well-being. Remember to consult with a healthcare professional or registered dietitian before making any significant dietary changes, especially if you have existing health conditions. Your personalized approach to a plant-based diet can contribute to improved urology health and enhance your quality of life.

## 2022-12-10 NOTE — Progress Notes (Signed)
   12/10/2022 3:10 PM   Edward Harmon 1955-09-27 [id number]  Reason for visit: Follow up prostate cancer, erectile dysfunction, urinary symptoms/dysuria  HPI: He is a 67 year old male who was originally diagnosed with low risk prostate cancer and a prostate MRI in June 2021 showed a PI-RADS 4 lesion and I recommended fusion biopsy, but he was lost to follow-up during the COVID pandemic.  PSA continued to rise and was 15.4, and he underwent a fusion biopsy in [place] in June 2022 that showed a 37 g prostate with favorable intermediate risk prostate cancer in the ROI.   He was taking Cialis at baseline 5 to 10 mg as needed for ED, and had a urinary symptoms of frequency during the day prior to undergoing any treatment for prostate cancer.  He was ultimately treated with 6 months of ADT(injection given 01/23/2021) and brachytherapy performed by Dr. Aggie Cosier and myself on 03/28/2021.  He had a very hard time after surgery with fatigue, hot flashes, weakness, dizziness.  I was not aware of these issues.  He was seen in the ER on 04/26/2021 and work-up revealed uncontrolled diabetes with blood sugar of 674 and hemoglobin A1c of 12.1.  Most recent hemoglobin A1c from from April 2024 improved to 6.3.  He was seen in the ER on 11/06/2022 for severe dysuria, urinalysis showed many bacteria but was otherwise benign, and he was treated with 10 days of Bactrim for possible UTI/prostatitis.  Urine culture was ultimately negative, but his symptoms resolved on the antibiotics.  He really denies any significant urinary complaints today.  He continues to have problems with ED.  He felt the Cialis 20 mg was not very helpful, and he tried the sildenafil 100 mg only a single time without significant improvement.  I encouraged him to try the sildenafil at least a few times.  We also discussed behavioral strategies including diet and weight loss.  He is unsure if he would be interested in penile injections.  PSA nadir  after brachytherapy was 0.28 in February 2023, most recent PSA 12/03/2022 was 1.9 which had increased from 1.1 in December 2023, however this was shortly after his possible UTI/prostatitis.  We discussed the concept of a PSA bounce after brachytherapy and the need to continue to monitor the PSA.  Continue sildenafil 100 mg on demand, okay to set up for intracavernosal injections in the future if he desires with PA RTC 4 months PSA prior, symptom check  Sondra Come, MD  [facility] [address] [place], [place] [address] [phone]

## 2022-12-17 ENCOUNTER — Encounter: Payer: Self-pay | Admitting: Gastroenterology

## 2022-12-17 NOTE — H&P (Signed)
Pre-Procedure H&P   Patient ID: Edward Harmon is a 67 y.o. male.  Gastroenterology Provider: Jaynie Collins, DO  Referring Provider: Fransico Setters, NP PCP: Carren Rang, PA-C  Date: 12/18/2022  HPI Edward Harmon is a 67 y.o. male who presents today for Colonoscopy for CRC screening .  Csy 07/2009- normal  No current gi sx. Bm regular no melena/hematochezia  S/p appendectomy  Ferritin 47 sat 45 13.6/84 plt 229 A1c 6.1    Past Medical History:  Diagnosis Date   Diabetes mellitus without complication (HCC)    Hyperlipidemia    Hypertension    Pre-diabetes    Prostate cancer [facility])     Past Surgical History:  Procedure Laterality Date   APPENDECTOMY     COLONOSCOPY  2011   FRACTURE SURGERY Right    hand   RADIOACTIVE SEED IMPLANT N/A 03/28/2021   Procedure: RADIOACTIVE SEED IMPLANT/BRACHYTHERAPY IMPLANT;  Surgeon: Sondra Come, MD;  Location: [facility] ORS;  Service: Urology;  Laterality: N/A;   VOLUME STUDY N/A 02/27/2021   Procedure: VOLUME STUDY;  Surgeon: Sondra Come, MD;  Location: [facility] ORS;  Service: Urology;  Laterality: N/A;    Family History Sister- etoh cirrhosis; mgf crc No other h/o GI disease or malignancy  Review of Systems  Constitutional:  Negative for activity change, appetite change, chills, diaphoresis, fatigue, fever and unexpected weight change.  HENT:  Negative for trouble swallowing and voice change.   Respiratory:  Negative for shortness of breath and wheezing.   Cardiovascular:  Negative for chest pain, palpitations and leg swelling.  Gastrointestinal:  Negative for abdominal distention, abdominal pain, anal bleeding, blood in stool, constipation, diarrhea, nausea and vomiting.  Musculoskeletal:  Negative for arthralgias and myalgias.  Skin:  Negative for color change and pallor.  Neurological:  Negative for dizziness, syncope and weakness.  Psychiatric/Behavioral:  Negative for confusion. The patient is not nervous/anxious.    All other systems reviewed and are negative.    Medications No current facility-administered medications on file prior to encounter.   Current Outpatient Medications on File Prior to Encounter  Medication Sig Dispense Refill   amLODipine (NORVASC) 10 MG tablet Take 10 mg by mouth at bedtime.     atorvastatin (LIPITOR) 40 MG tablet Take 40 mg by mouth at bedtime.     glimepiride (AMARYL) 2 MG tablet Take 2 mg by mouth 2 (two) times daily.     ACCU-CHEK GUIDE test strip See admin instructions.     Accu-Chek Softclix Lancets lancets as directed.     cholecalciferol (VITAMIN D3) 25 MCG (1000 UNIT) tablet Take 1,000 Units by mouth daily. (Patient not taking: Reported on 12/18/2022)     Continuous Blood Gluc Receiver (FREESTYLE LIBRE 2 READER) DEVI Use 1 Device as directed     Continuous Blood Gluc Sensor (FREESTYLE LIBRE 2 SENSOR) MISC USE 1 KIT FOR GLUCOSE MONITORING      Pertinent medications related to GI and procedure were reviewed by me with the patient prior to the procedure   Current Facility-Administered Medications:    0.9 %  sodium chloride infusion, , Intravenous, Continuous, Jaynie Collins, DO  sodium chloride         No Known Allergies Allergies were reviewed by me prior to the procedure  Objective   Body mass index is 33.64 kg/m. Vitals:   12/18/22 1155  BP: (!) 159/80  Pulse: 89  Resp: 16  Temp: 97.8 F (36.6 C)  TempSrc: Temporal  SpO2: 100%  Weight: 103.3 kg  Height: 5\' 9"  (1.753 m)     Physical Exam Vitals and nursing note reviewed.  Constitutional:      General: He is not in acute distress.    Appearance: Normal appearance. He is obese. He is not ill-appearing, toxic-appearing or diaphoretic.  HENT:     Head: Normocephalic and atraumatic.     Nose: Nose normal.     Mouth/Throat:     Mouth: Mucous membranes are moist.     Pharynx: Oropharynx is clear.  Eyes:     General: No scleral icterus.    Extraocular Movements: Extraocular  movements intact.  Cardiovascular:     Rate and Rhythm: Normal rate and regular rhythm.     Heart sounds: Normal heart sounds. No murmur heard.    No friction rub. No gallop.  Pulmonary:     Effort: Pulmonary effort is normal. No respiratory distress.     Breath sounds: Normal breath sounds. No wheezing, rhonchi or rales.  Abdominal:     General: Bowel sounds are normal. There is no distension.     Palpations: Abdomen is soft.     Tenderness: There is no abdominal tenderness. There is no guarding or rebound.  Musculoskeletal:     Cervical back: Neck supple.     Right lower leg: No edema.     Left lower leg: No edema.  Skin:    General: Skin is warm and dry.     Coloration: Skin is not jaundiced or pale.  Neurological:     General: No focal deficit present.     Mental Status: He is alert and oriented to person, place, and time. Mental status is at baseline.  Psychiatric:        Mood and Affect: Mood normal.        Behavior: Behavior normal.        Thought Content: Thought content normal.        Judgment: Judgment normal.      Assessment:  Edward Harmon is a 67 y.o. male  who presents today for Colonoscopy for CRC screening .  Plan:  Colonoscopy with possible intervention today  Colonoscopy with possible biopsy, control of bleeding, polypectomy, and interventions as necessary has been discussed with the patient/patient representative. Informed consent was obtained from the patient/patient representative after explaining the indication, nature, and risks of the procedure including but not limited to death, bleeding, perforation, missed neoplasm/lesions, cardiorespiratory compromise, and reaction to medications. Opportunity for questions was given and appropriate answers were provided. Patient/patient representative has verbalized understanding is amenable to undergoing the procedure.   Jaynie Collins, DO  [facility] Gastroenterology  Portions of the record may  have been created with voice recognition software. Occasional wrong-word or 'sound-a-like' substitutions may have occurred due to the inherent limitations of voice recognition software.  Read the chart carefully and recognize, using context, where substitutions may have occurred.

## 2022-12-18 ENCOUNTER — Encounter: Payer: Self-pay | Admitting: Gastroenterology

## 2022-12-18 ENCOUNTER — Ambulatory Visit: Payer: Federal, State, Local not specified - PPO | Admitting: Anesthesiology

## 2022-12-18 ENCOUNTER — Other Ambulatory Visit: Payer: Self-pay

## 2022-12-18 ENCOUNTER — Encounter
Admission: RE | Disposition: A | Discharge status: Home / Self Care | Payer: Self-pay | Source: Ambulatory Visit | Attending: Gastroenterology

## 2022-12-18 ENCOUNTER — Ambulatory Visit
Admission: RE | Admit: 2022-12-18 | Discharge: 2022-12-18 | Disposition: A | Discharge status: Home / Self Care | Payer: Federal, State, Local not specified - PPO | Source: Ambulatory Visit | Attending: Gastroenterology | Admitting: Gastroenterology

## 2022-12-18 DIAGNOSIS — E119 Type 2 diabetes mellitus without complications: Secondary | ICD-10-CM | POA: Diagnosis not present

## 2022-12-18 DIAGNOSIS — Z7984 Long term (current) use of oral hypoglycemic drugs: Secondary | ICD-10-CM | POA: Diagnosis not present

## 2022-12-18 DIAGNOSIS — E785 Hyperlipidemia, unspecified: Secondary | ICD-10-CM | POA: Insufficient documentation

## 2022-12-18 DIAGNOSIS — Z1211 Encounter for screening for malignant neoplasm of colon: Secondary | ICD-10-CM | POA: Insufficient documentation

## 2022-12-18 DIAGNOSIS — Z9049 Acquired absence of other specified parts of digestive tract: Secondary | ICD-10-CM | POA: Insufficient documentation

## 2022-12-18 DIAGNOSIS — I10 Essential (primary) hypertension: Secondary | ICD-10-CM | POA: Insufficient documentation

## 2022-12-18 DIAGNOSIS — Z8546 Personal history of malignant neoplasm of prostate: Secondary | ICD-10-CM | POA: Insufficient documentation

## 2022-12-18 DIAGNOSIS — K640 First degree hemorrhoids: Secondary | ICD-10-CM | POA: Diagnosis not present

## 2022-12-18 HISTORY — DX: Type 2 diabetes mellitus without complications: E11.9

## 2022-12-18 HISTORY — PX: COLONOSCOPY: SHX5424

## 2022-12-18 LAB — GLUCOSE, CAPILLARY: Glucose-Capillary: 118 mg/dL — ABNORMAL HIGH (ref 70–99)

## 2022-12-18 SURGERY — COLONOSCOPY
Anesthesia: General

## 2022-12-18 MED ORDER — GLYCOPYRROLATE 0.2 MG/ML IJ SOLN
INTRAMUSCULAR | Status: DC | PRN
  Administered 2022-12-18: .2 mg via INTRAVENOUS

## 2022-12-18 MED ORDER — PHENYLEPHRINE 80 MCG/ML (10ML) SYRINGE FOR IV PUSH (FOR BLOOD PRESSURE SUPPORT)
PREFILLED_SYRINGE | INTRAVENOUS | Status: DC | PRN
  Administered 2022-12-18: 160 ug via INTRAVENOUS

## 2022-12-18 MED ORDER — PROPOFOL 10 MG/ML IV BOLUS
INTRAVENOUS | Status: DC | PRN
  Administered 2022-12-18: 70 mg via INTRAVENOUS
  Administered 2022-12-18: 30 mg via INTRAVENOUS

## 2022-12-18 MED ORDER — PROPOFOL 500 MG/50ML IV EMUL
INTRAVENOUS | Status: DC | PRN
  Administered 2022-12-18: 165 ug/kg/min via INTRAVENOUS

## 2022-12-18 MED ORDER — LIDOCAINE HCL (CARDIAC) PF 100 MG/5ML IV SOSY
PREFILLED_SYRINGE | INTRAVENOUS | Status: DC | PRN
  Administered 2022-12-18: 100 mg via INTRAVENOUS

## 2022-12-18 MED ORDER — SODIUM CHLORIDE 0.9 % IV SOLN: INTRAVENOUS | Status: DC

## 2022-12-18 NOTE — Anesthesia Postprocedure Evaluation (Signed)
Anesthesia Post Note  Patient: Edward Harmon  Procedure(s) Performed: COLONOSCOPY  Patient location during evaluation: Endoscopy Anesthesia Type: General Level of consciousness: awake and alert Pain management: pain level controlled Vital Signs Assessment: post-procedure vital signs reviewed and stable Respiratory status: spontaneous breathing, nonlabored ventilation, respiratory function stable and patient connected to nasal cannula oxygen Cardiovascular status: blood pressure returned to baseline and stable Postop Assessment: no apparent nausea or vomiting Anesthetic complications: no   No notable events documented.   Last Vitals:  Vitals:   12/18/22 1344 12/18/22 1354  BP: 121/84 132/74  Pulse: 77 69  Resp: 20 18  Temp:    SpO2: 96% 98%    Last Pain:  Vitals:   12/18/22 1354  TempSrc:   PainSc: 0-No pain                 Meghanne Pletz K Micheal Sheen      

## 2022-12-18 NOTE — Interval H&P Note (Signed)
History and Physical Interval Note: Preprocedure H&P from 12/18/22  was reviewed and there was no interval change after seeing and examining the patient.  Written consent was obtained from the patient after discussion of risks, benefits, and alternatives. Patient has consented to proceed with Colonoscopy with possible intervention   12/18/2022 12:13 PM  Arnette Schaumann  has presented today for surgery, with the diagnosis of Colon cancer screening (Z12.11).  The various methods of treatment have been discussed with the patient and family. After consideration of risks, benefits and other options for treatment, the patient has consented to  Procedure(s): COLONOSCOPY (N/A) as a surgical intervention.  The patient's history has been reviewed, patient examined, no change in status, stable for surgery.  I have reviewed the patient's chart and labs.  Questions were answered to the patient's satisfaction.     Jaynie Collins

## 2022-12-18 NOTE — Op Note (Signed)
[facility] Gastroenterology Patient Name: Edward Harmon Procedure Date: 12/18/2022 12:53 PM MRN: [id number] Account #: [id number] Date of Birth: [birth year] Admit Type: Outpatient Age: 67 Room: [facility] ROOM 1 Gender: Male Note Status: Finalized Instrument Name: Colonoscope [id number] Procedure:             Colonoscopy Indications:           Screening for colorectal malignant neoplasm Providers:             Trenda Moots, DO Referring MD:          Carren Rang (Referring MD) Medicines:             Monitored Anesthesia Care Complications:         No immediate complications. Estimated blood loss:                         Minimal. Procedure:             Pre-Anesthesia Assessment:                        - Prior to the procedure, a History and Physical was                         performed, and patient medications and allergies were                         reviewed. The patient is competent. The risks and                         benefits of the procedure and the sedation options and                         risks were discussed with the patient. All questions                         were answered and informed consent was obtained.                         Patient identification and proposed procedure were                         verified by the physician, the nurse, the anesthetist                         and the technician in the endoscopy suite. Mental                         Status Examination: alert and oriented. Airway                         Examination: normal oropharyngeal airway and neck                         mobility. Respiratory Examination: clear to                         auscultation. CV Examination: RRR, no murmurs, no S3  or S4. Prophylactic Antibiotics: The patient does not                         require prophylactic antibiotics. Prior                         Anticoagulants: The patient has taken no anticoagulant                          or antiplatelet agents. ASA Grade Assessment: III - A                         patient with severe systemic disease. After reviewing                         the risks and benefits, the patient was deemed in                         satisfactory condition to undergo the procedure. The                         anesthesia plan was to use monitored anesthesia care                         (MAC). Immediately prior to administration of                         medications, the patient was re-assessed for adequacy                         to receive sedatives. The heart rate, respiratory                         rate, oxygen saturations, blood pressure, adequacy of                         pulmonary ventilation, and response to care were                         monitored throughout the procedure. The physical                         status of the patient was re-assessed after the                         procedure.                        After obtaining informed consent, the colonoscope was                         passed under direct vision. Throughout the procedure,                         the patient's blood pressure, pulse, and oxygen                         saturations were monitored continuously. The  Colonoscope was introduced through the anus and                         advanced to the the terminal ileum, with                         identification of the appendiceal orifice and IC                         valve. The colonoscopy was performed without                         difficulty. The patient tolerated the procedure well.                         The terminal ileum, ileocecal valve, appendiceal                         orifice, and rectum were photographed. The quality of                         the bowel preparation was evaluated using the BBPS                         [facility] Bowel Preparation Scale) with scores of: Right                         Colon =  2 (minor amount of residual staining, small                         fragments of stool and/or opaque liquid, but mucosa                         seen well), Transverse Colon = 3 (entire mucosa seen                         well with no residual staining, small fragments of                         stool or opaque liquid) and Left Colon = 3 (entire                         mucosa seen well with no residual staining, small                         fragments of stool or opaque liquid). The total BBPS                         score equals 8. The quality of the bowel preparation                         was excellent. Findings:      The perianal and digital rectal examinations were normal. Pertinent       negatives include normal sphincter tone.      The terminal ileum appeared normal. Estimated blood loss: none.      Non-bleeding internal hemorrhoids were found during retroflexion. The  hemorrhoids were Grade I (internal hemorrhoids that do not prolapse).       Estimated blood loss: none.      The exam was otherwise without abnormality on direct and retroflexion       views. Impression:            - The examined portion of the ileum was normal.                        - Non-bleeding internal hemorrhoids.                        - The examination was otherwise normal on direct and                         retroflexion views.                        - No specimens collected. Recommendation:        - Patient has a contact number available for                         emergencies. The signs and symptoms of potential                         delayed complications were discussed with the patient.                         Return to normal activities tomorrow. Written                         discharge instructions were provided to the patient.                        - Discharge patient to home.                        - Resume previous diet.                        - Continue present medications.                         - Repeat colonoscopy in 10 years for screening                         purposes.                        - Return to referring physician as previously                         scheduled.                        - The findings and recommendations were discussed with                         the patient. Procedure Code(s):     --- Professional ---                        [phone], Colonoscopy, flexible;  diagnostic, including                         collection of specimen(s) by brushing or washing, when                         performed (separate procedure) Diagnosis Code(s):     --- Professional ---                        Z12.11, Encounter for screening for malignant neoplasm                         of colon                        K64.0, First degree hemorrhoids CPT copyright 2022 American [employer] Association. All rights reserved. The codes documented in this report are preliminary and upon coder review may  be revised to meet current compliance requirements. Attending Participation:      I personally performed the entire procedure. Elfredia Nevins, DO Jaynie Collins DO, DO 12/18/2022 1:24:41 PM This report has been signed electronically. Number of Addenda: 0 Note Initiated On: 12/18/2022 12:53 PM Scope Withdrawal Time: 0 hours 10 minutes 22 seconds  Total Procedure Duration: 0 hours 15 minutes 10 seconds  Estimated Blood Loss:  Estimated blood loss was minimal.      [facility]

## 2022-12-18 NOTE — Anesthesia Preprocedure Evaluation (Signed)
Anesthesia Evaluation  Patient identified by MRN, date of birth, ID band Patient awake    Reviewed: Allergy & Precautions, NPO status , Patient's Chart, lab work & pertinent test results  History of Anesthesia Complications Negative for: history of anesthetic complications  Airway Mallampati: III  TM Distance: <3 FB Neck ROM: full    Dental  (+) Chipped, Poor Dentition, Missing   Pulmonary neg pulmonary ROS, neg shortness of breath   Pulmonary exam normal        Cardiovascular Exercise Tolerance: Good hypertension, (-) angina Normal cardiovascular exam     Neuro/Psych negative neurological ROS  negative psych ROS   GI/Hepatic negative GI ROS, Neg liver ROS,neg GERD  ,,  Endo/Other  diabetes, Type 2    Renal/GU negative Renal ROS  negative genitourinary   Musculoskeletal   Abdominal   Peds  Hematology negative hematology ROS (+)   Anesthesia Other Findings Past Medical History: No date: Diabetes mellitus without complication (HCC) No date: Hyperlipidemia No date: Hypertension No date: Pre-diabetes No date: Prostate cancer [facility])  Past Surgical History: No date: APPENDECTOMY 2011: COLONOSCOPY No date: FRACTURE SURGERY; Right     Comment:  hand 03/28/2021: RADIOACTIVE SEED IMPLANT; N/A     Comment:  Procedure: RADIOACTIVE SEED IMPLANT/BRACHYTHERAPY               IMPLANT;  Surgeon: Sondra Come, MD;  Location: [facility]               ORS;  Service: Urology;  Laterality: N/A; 02/27/2021: VOLUME STUDY; N/A     Comment:  Procedure: VOLUME STUDY;  Surgeon: Sondra Come, MD;              Location: [facility] ORS;  Service: Urology;  Laterality: N/A;  BMI    Body Mass Index: 33.64 kg/m      Reproductive/Obstetrics negative OB ROS                             Anesthesia Physical Anesthesia Plan  ASA: 3  Anesthesia Plan: General   Post-op Pain Management:    Induction:  Intravenous  PONV Risk Score and Plan: Propofol infusion and TIVA  Airway Management Planned: Natural Airway and Nasal Cannula  Additional Equipment:   Intra-op Plan:   Post-operative Plan:   Informed Consent: I have reviewed the patients History and Physical, chart, labs and discussed the procedure including the risks, benefits and alternatives for the proposed anesthesia with the patient or authorized representative who has indicated his/her understanding and acceptance.     Dental Advisory Given  Plan Discussed with: Anesthesiologist, CRNA and Surgeon  Anesthesia Plan Comments: (Patient consented for risks of anesthesia including but not limited to:  - adverse reactions to medications - risk of airway placement if required - damage to eyes, teeth, lips or other oral mucosa - nerve damage due to positioning  - sore throat or hoarseness - Damage to heart, brain, nerves, lungs, other parts of body or loss of life  Patient voiced understanding.)       Anesthesia Quick Evaluation

## 2022-12-18 NOTE — Anesthesia Postprocedure Evaluation (Signed)
Anesthesia Post Note  Patient: Edward Harmon  Procedure(s) Performed: COLONOSCOPY  Patient location during evaluation: Endoscopy Anesthesia Type: General Level of consciousness: awake and alert Pain management: pain level controlled Vital Signs Assessment: post-procedure vital signs reviewed and stable Respiratory status: spontaneous breathing, nonlabored ventilation, respiratory function stable and patient connected to nasal cannula oxygen Cardiovascular status: blood pressure returned to baseline and stable Postop Assessment: no apparent nausea or vomiting Anesthetic complications: no   No notable events documented.   Last Vitals:  Vitals:   12/18/22 1344 12/18/22 1354  BP: 121/84 132/74  Pulse: 77 69  Resp: 20 18  Temp:    SpO2: 96% 98%    Last Pain:  Vitals:   12/18/22 1354  TempSrc:   PainSc: 0-No pain                 Cleda Mccreedy Latonya Nelon

## 2022-12-18 NOTE — Transfer of Care (Signed)
Immediate Anesthesia Transfer of Care Note  Patient: Edward Harmon  Procedure(s) Performed: COLONOSCOPY  Patient Location: Endoscopy Unit  Anesthesia Type:General  Level of Consciousness: drowsy and patient cooperative  Airway & Oxygen Therapy: Patient Spontanous Breathing and Patient connected to face mask oxygen  Post-op Assessment: Report given to RN and Post -op Vital signs reviewed and stable  Post vital signs: Reviewed and stable  Last Vitals:  Vitals Value Taken Time  BP 104/64 12/18/22 1324  Temp    Pulse 90 12/18/22 1324  Resp 10 12/18/22 1324  SpO2 100 % 12/18/22 1324  Vitals shown include unvalidated device data.  Last Pain:  Vitals:   12/18/22 1155  TempSrc: Temporal  PainSc: 0-No pain         Complications: No notable events documented.

## 2022-12-21 ENCOUNTER — Encounter: Payer: Self-pay | Admitting: Gastroenterology

## 2023-01-26 IMAGING — CR DG CHEST 2V
1 series · 2 of 2 positions shown · non-contrast
Comparison: April 25, 2006.

CLINICAL DATA: Dyspnea on exertion.

EXAM:
CHEST - 2 VIEW

[Series 1: dg chest 2 view · 0.14mm/px · 2 of 2 slices shown]
[im 1/2]
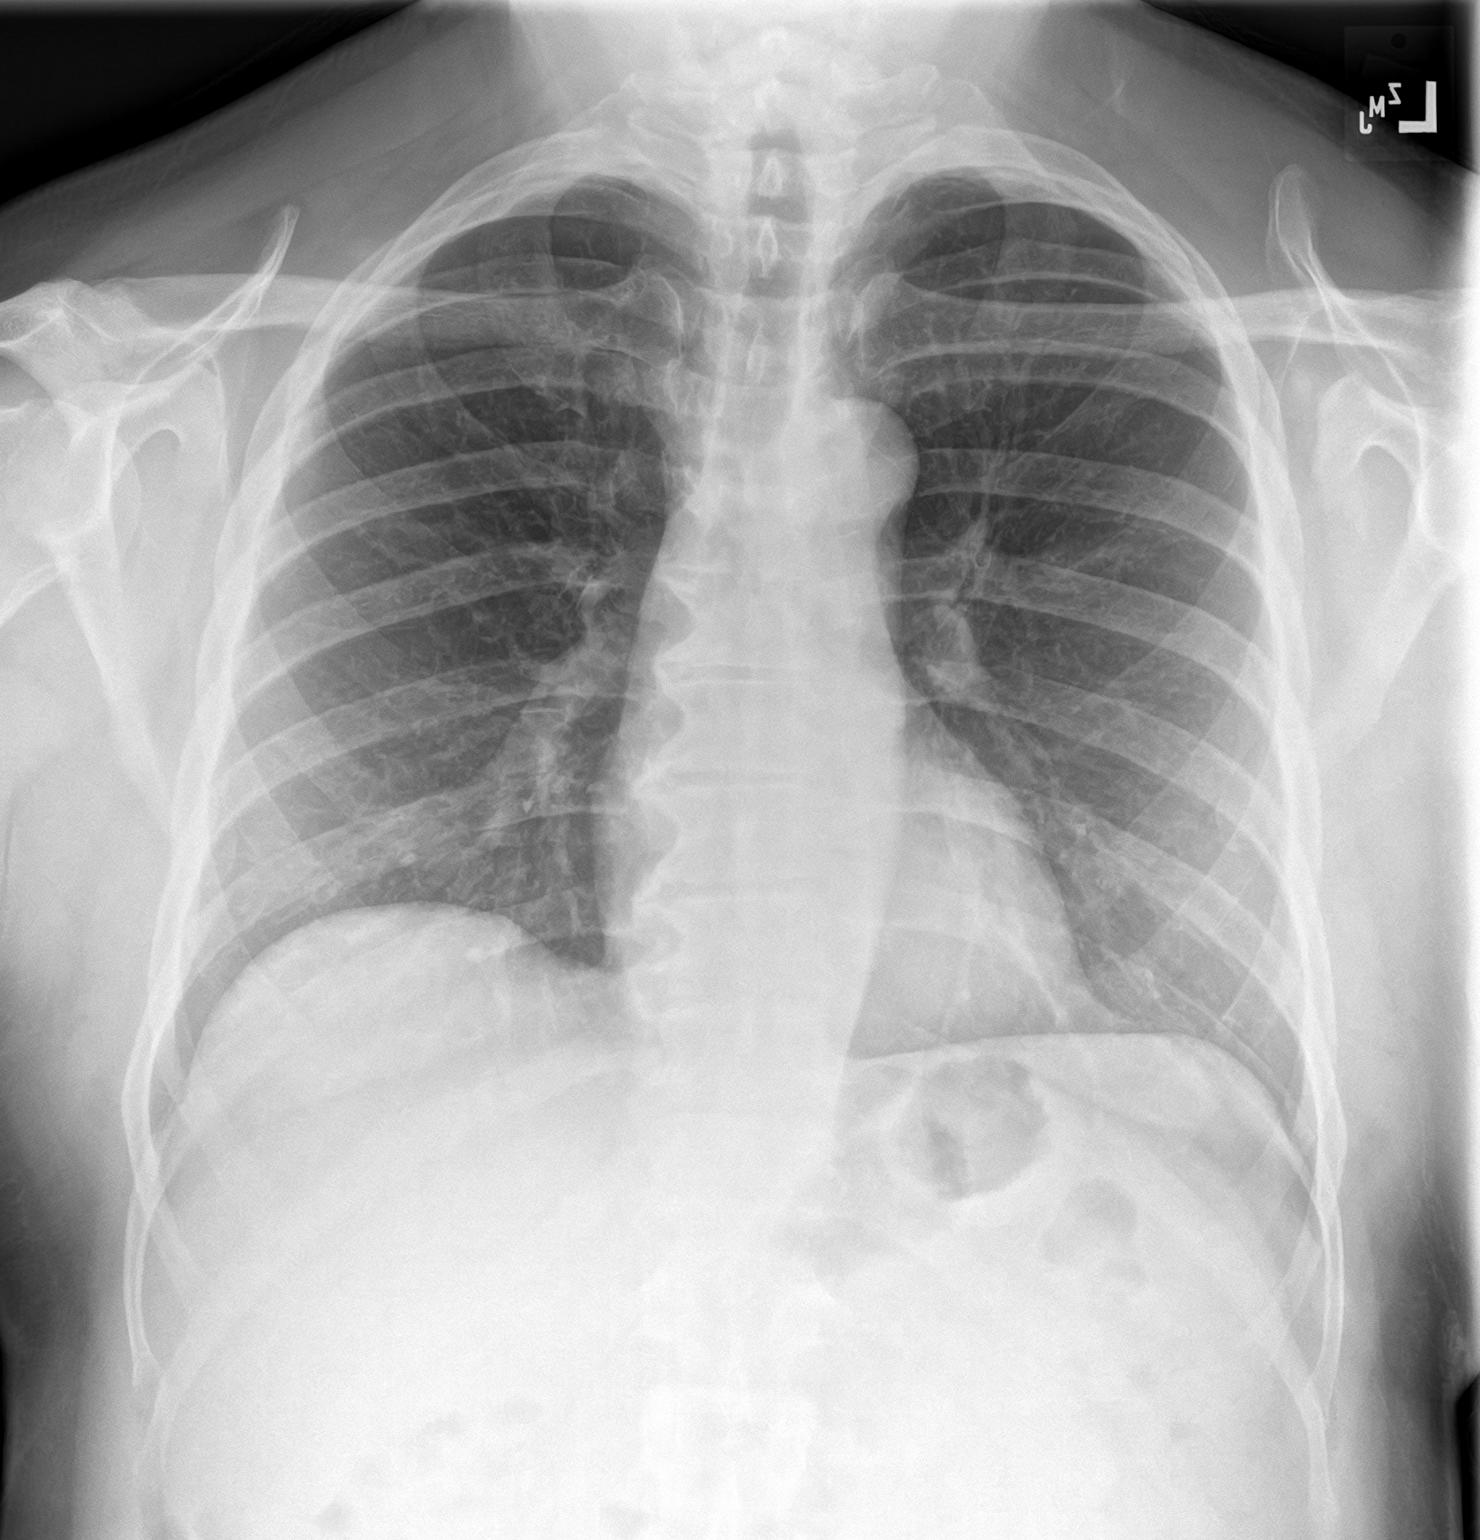
[im 2/2]
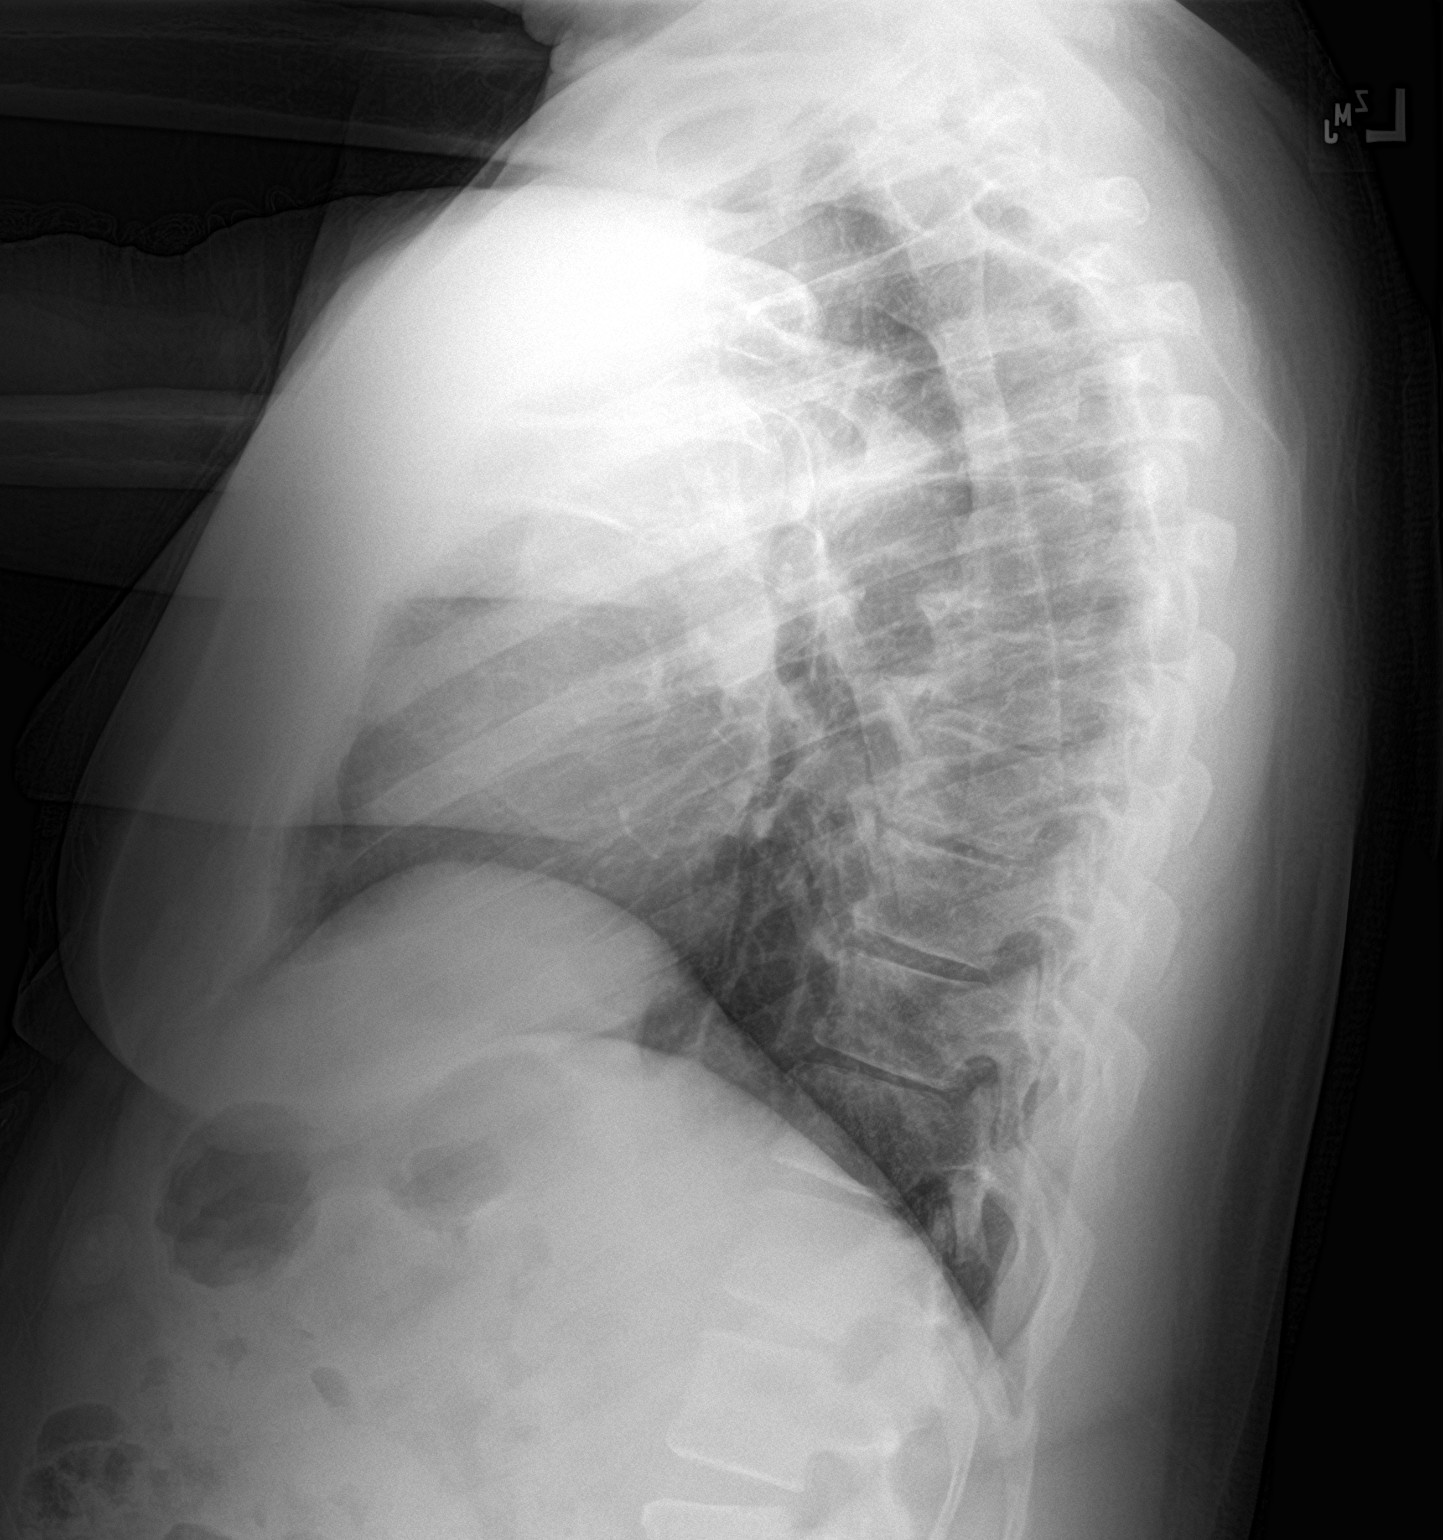

[2 of 2 positions shown; findings below may reference images not displayed]

FINDINGS: The heart size and mediastinal contours are within normal limits.
Both lungs are clear. The visualized skeletal structures are
unremarkable.
IMPRESSION: No active cardiopulmonary disease.

## 2023-03-22 ENCOUNTER — Other Ambulatory Visit: Payer: Self-pay

## 2023-03-22 ENCOUNTER — Observation Stay
Admission: RE | Admit: 2023-03-22 | Discharge: 2023-03-23 | Disposition: A | Discharge status: Home / Self Care | Payer: Federal, State, Local not specified - PPO | Source: Ambulatory Visit | Attending: Internal Medicine | Admitting: Internal Medicine

## 2023-03-22 ENCOUNTER — Encounter
Admission: RE | Disposition: A | Discharge status: Home / Self Care | Payer: Self-pay | Source: Ambulatory Visit | Attending: Internal Medicine

## 2023-03-22 DIAGNOSIS — R079 Chest pain, unspecified: Secondary | ICD-10-CM

## 2023-03-22 DIAGNOSIS — Z79899 Other long term (current) drug therapy: Secondary | ICD-10-CM | POA: Insufficient documentation

## 2023-03-22 DIAGNOSIS — R0602 Shortness of breath: Principal | ICD-10-CM

## 2023-03-22 DIAGNOSIS — I10 Essential (primary) hypertension: Secondary | ICD-10-CM | POA: Diagnosis not present

## 2023-03-22 DIAGNOSIS — Z7982 Long term (current) use of aspirin: Secondary | ICD-10-CM | POA: Insufficient documentation

## 2023-03-22 DIAGNOSIS — Z8546 Personal history of malignant neoplasm of prostate: Secondary | ICD-10-CM | POA: Insufficient documentation

## 2023-03-22 DIAGNOSIS — Z955 Presence of coronary angioplasty implant and graft: Secondary | ICD-10-CM

## 2023-03-22 DIAGNOSIS — I2510 Atherosclerotic heart disease of native coronary artery without angina pectoris: Principal | ICD-10-CM | POA: Insufficient documentation

## 2023-03-22 HISTORY — PX: LEFT HEART CATH AND CORONARY ANGIOGRAPHY: CATH118249

## 2023-03-22 HISTORY — PX: CORONARY STENT INTERVENTION: CATH118234

## 2023-03-22 LAB — POCT ACTIVATED CLOTTING TIME: Activated Clotting Time: 415 seconds

## 2023-03-22 LAB — GLUCOSE, CAPILLARY
Glucose-Capillary: 111 mg/dL — ABNORMAL HIGH (ref 70–99)
Glucose-Capillary: 127 mg/dL — ABNORMAL HIGH (ref 70–99)

## 2023-03-22 LAB — CARDIAC CATHETERIZATION: Cath EF Quantitative: 60 %

## 2023-03-22 SURGERY — LEFT HEART CATH AND CORONARY ANGIOGRAPHY
Anesthesia: Moderate Sedation

## 2023-03-22 MED ORDER — SODIUM CHLORIDE 0.9 % IV SOLN: 250.0000 mL | INTRAVENOUS | Status: DC | PRN

## 2023-03-22 MED ORDER — MIDAZOLAM HCL 2 MG/2ML IJ SOLN
INTRAMUSCULAR | Status: DC | PRN
  Administered 2023-03-22 (×3): 1 mg via INTRAVENOUS

## 2023-03-22 MED ORDER — METOPROLOL SUCCINATE ER 25 MG PO TB24
25.0000 mg | ORAL_TABLET | Freq: Every day | ORAL | Status: DC
  Administered 2023-03-22 – 2023-03-23 (×2): 25 mg via ORAL
  Filled 2023-03-22 (×2): qty 1

## 2023-03-22 MED ORDER — ASPIRIN 81 MG PO TBEC: 81.0000 mg | DELAYED_RELEASE_TABLET | Freq: Every day | ORAL | 0 refills | Status: AC

## 2023-03-22 MED ORDER — HYDRALAZINE HCL 20 MG/ML IJ SOLN: 10.0000 mg | INTRAMUSCULAR | Status: AC | PRN

## 2023-03-22 MED ORDER — VERAPAMIL HCL 2.5 MG/ML IV SOLN
INTRAVENOUS | Status: AC
  Filled 2023-03-22: qty 2

## 2023-03-22 MED ORDER — HEPARIN SODIUM (PORCINE) 1000 UNIT/ML IJ SOLN
INTRAMUSCULAR | Status: AC
  Filled 2023-03-22: qty 10

## 2023-03-22 MED ORDER — ASPIRIN 81 MG PO CHEW
CHEWABLE_TABLET | ORAL | Status: AC
  Filled 2023-03-22: qty 3

## 2023-03-22 MED ORDER — TICAGRELOR 90 MG PO TABS: 90.0000 mg | ORAL_TABLET | Freq: Two times a day (BID) | ORAL | 0 refills | Status: DC

## 2023-03-22 MED ORDER — SODIUM CHLORIDE 0.9 % WEIGHT BASED INFUSION: 1.0000 mL/kg/h | INTRAVENOUS | Status: AC

## 2023-03-22 MED ORDER — MIDAZOLAM HCL 2 MG/2ML IJ SOLN
INTRAMUSCULAR | Status: AC
  Filled 2023-03-22: qty 2

## 2023-03-22 MED ORDER — AMLODIPINE BESYLATE 10 MG PO TABS
10.0000 mg | ORAL_TABLET | Freq: Every day | ORAL | Status: DC
  Administered 2023-03-22 – 2023-03-23 (×2): 10 mg via ORAL
  Filled 2023-03-22 (×2): qty 1

## 2023-03-22 MED ORDER — FENTANYL CITRATE (PF) 100 MCG/2ML IJ SOLN
INTRAMUSCULAR | Status: AC
  Filled 2023-03-22: qty 2

## 2023-03-22 MED ORDER — HEPARIN (PORCINE) IN NACL 2000-0.9 UNIT/L-% IV SOLN
INTRAVENOUS | Status: DC | PRN
  Administered 2023-03-22: 1000 mL

## 2023-03-22 MED ORDER — LIDOCAINE HCL (PF) 1 % IJ SOLN
INTRAMUSCULAR | Status: DC | PRN
  Administered 2023-03-22: 2 mL

## 2023-03-22 MED ORDER — SODIUM CHLORIDE 0.9% FLUSH: 3.0000 mL | Freq: Two times a day (BID) | INTRAVENOUS | Status: DC

## 2023-03-22 MED ORDER — ATORVASTATIN CALCIUM 80 MG PO TABS
80.0000 mg | ORAL_TABLET | Freq: Every day | ORAL | Status: DC
  Administered 2023-03-22 – 2023-03-23 (×2): 80 mg via ORAL
  Filled 2023-03-22 (×2): qty 1

## 2023-03-22 MED ORDER — ASPIRIN 81 MG PO CHEW
81.0000 mg | CHEWABLE_TABLET | ORAL | Status: AC
  Administered 2023-03-22: 81 mg via ORAL

## 2023-03-22 MED ORDER — ASPIRIN 81 MG PO CHEW
CHEWABLE_TABLET | ORAL | Status: AC
  Filled 2023-03-22: qty 1

## 2023-03-22 MED ORDER — TICAGRELOR 90 MG PO TABS
ORAL_TABLET | ORAL | Status: DC | PRN
  Administered 2023-03-22: 180 mg via ORAL

## 2023-03-22 MED ORDER — IOHEXOL 300 MG/ML  SOLN
INTRAMUSCULAR | Status: DC | PRN
  Administered 2023-03-22: 145 mL

## 2023-03-22 MED ORDER — GLIMEPIRIDE 2 MG PO TABS
2.0000 mg | ORAL_TABLET | Freq: Two times a day (BID) | ORAL | Status: DC
  Administered 2023-03-22 – 2023-03-23 (×2): 2 mg via ORAL
  Filled 2023-03-22 (×2): qty 1

## 2023-03-22 MED ORDER — ONDANSETRON HCL 4 MG/2ML IJ SOLN: 4.0000 mg | Freq: Four times a day (QID) | INTRAMUSCULAR | Status: DC | PRN

## 2023-03-22 MED ORDER — FENTANYL CITRATE (PF) 100 MCG/2ML IJ SOLN
INTRAMUSCULAR | Status: DC | PRN
  Administered 2023-03-22: 50 ug via INTRAVENOUS
  Administered 2023-03-22: 25 ug via INTRAVENOUS

## 2023-03-22 MED ORDER — SODIUM CHLORIDE 0.9% FLUSH
3.0000 mL | Freq: Two times a day (BID) | INTRAVENOUS | Status: DC
  Administered 2023-03-22 – 2023-03-23 (×2): 3 mL via INTRAVENOUS

## 2023-03-22 MED ORDER — SODIUM CHLORIDE 0.9 % WEIGHT BASED INFUSION
3.0000 mL/kg/h | INTRAVENOUS | Status: AC
  Administered 2023-03-22: 3 mL/kg/h via INTRAVENOUS

## 2023-03-22 MED ORDER — SODIUM CHLORIDE 0.9 % WEIGHT BASED INFUSION
1.0000 mL/kg/h | INTRAVENOUS | Status: DC
  Administered 2023-03-22: 1 mL/kg/h via INTRAVENOUS

## 2023-03-22 MED ORDER — SODIUM CHLORIDE 0.9% FLUSH: 3.0000 mL | INTRAVENOUS | Status: DC | PRN

## 2023-03-22 MED ORDER — ASPIRIN 81 MG PO CHEW
81.0000 mg | CHEWABLE_TABLET | Freq: Every day | ORAL | Status: DC
  Administered 2023-03-23: 81 mg via ORAL
  Filled 2023-03-22: qty 1

## 2023-03-22 MED ORDER — HEPARIN (PORCINE) IN NACL 1000-0.9 UT/500ML-% IV SOLN
INTRAVENOUS | Status: AC
  Filled 2023-03-22: qty 1000

## 2023-03-22 MED ORDER — LABETALOL HCL 5 MG/ML IV SOLN: 10.0000 mg | INTRAVENOUS | Status: AC | PRN

## 2023-03-22 MED ORDER — HEPARIN SODIUM (PORCINE) 1000 UNIT/ML IJ SOLN
INTRAMUSCULAR | Status: DC | PRN
  Administered 2023-03-22: 5000 [IU] via INTRAVENOUS
  Administered 2023-03-22: 8000 [IU] via INTRAVENOUS

## 2023-03-22 MED ORDER — ACETAMINOPHEN 325 MG PO TABS: 650.0000 mg | ORAL_TABLET | ORAL | Status: DC | PRN

## 2023-03-22 MED ORDER — TICAGRELOR 90 MG PO TABS
ORAL_TABLET | ORAL | Status: AC
  Filled 2023-03-22: qty 2

## 2023-03-22 MED ORDER — ASPIRIN 81 MG PO CHEW
CHEWABLE_TABLET | ORAL | Status: DC | PRN
  Administered 2023-03-22: 243 mg via ORAL

## 2023-03-22 MED ORDER — TICAGRELOR 90 MG PO TABS
90.0000 mg | ORAL_TABLET | Freq: Two times a day (BID) | ORAL | Status: DC
  Administered 2023-03-22 – 2023-03-23 (×2): 90 mg via ORAL
  Filled 2023-03-22 (×2): qty 1

## 2023-03-22 MED ORDER — VERAPAMIL HCL 2.5 MG/ML IV SOLN
INTRAVENOUS | Status: DC | PRN
  Administered 2023-03-22: 2.5 mg via INTRAVENOUS

## 2023-03-22 SURGICAL SUPPLY — 17 items
BALLN TREK RX 2.5X12 (BALLOONS) ×2
BALLOON TREK RX 2.5X12 (BALLOONS) IMPLANT
CATH 5FR JL3.5 JR4 ANG PIG MP (CATHETERS) IMPLANT
CATH VISTA GUIDE 6FR JR4 (CATHETERS) IMPLANT
DEVICE RAD TR BAND REGULAR (VASCULAR PRODUCTS) IMPLANT
DRAPE BRACHIAL (DRAPES) IMPLANT
GLIDESHEATH SLEND SS 6F .021 (SHEATH) IMPLANT
GUIDEWIRE INQWIRE 1.5J.035X260 (WIRE) IMPLANT
INQWIRE 1.5J .035X260CM (WIRE) ×2
KIT ENCORE 26 ADVANTAGE (KITS) IMPLANT
PACK CARDIAC CATH (CUSTOM PROCEDURE TRAY) ×2 IMPLANT
PROTECTION STATION PRESSURIZED (MISCELLANEOUS) ×2
SET ATX-X65L (MISCELLANEOUS) IMPLANT
STATION PROTECTION PRESSURIZED (MISCELLANEOUS) IMPLANT
STENT ONYX FRONTIER 3.0X12 (Permanent Stent) IMPLANT
TUBING CIL FLEX 10 FLL-RA (TUBING) IMPLANT
WIRE G HI TQ BMW 190 (WIRE) IMPLANT

## 2023-03-22 NOTE — Plan of Care (Signed)

## 2023-03-23 ENCOUNTER — Other Ambulatory Visit: Payer: Self-pay

## 2023-03-23 ENCOUNTER — Encounter: Payer: Self-pay | Admitting: Internal Medicine

## 2023-03-23 DIAGNOSIS — I2510 Atherosclerotic heart disease of native coronary artery without angina pectoris: Secondary | ICD-10-CM | POA: Diagnosis not present

## 2023-03-23 LAB — BASIC METABOLIC PANEL
Anion gap: 9 (ref 5–15)
BUN: 20 mg/dL (ref 8–23)
CO2: 26 mmol/L (ref 22–32)
Calcium: 8.9 mg/dL (ref 8.9–10.3)
Chloride: 103 mmol/L (ref 98–111)
Creatinine, Ser: 1.38 mg/dL — ABNORMAL HIGH (ref 0.61–1.24)
GFR, Estimated: 57 mL/min — ABNORMAL LOW (ref >60)
Glucose, Bld: 117 mg/dL — ABNORMAL HIGH (ref 70–99)
Potassium: 4.3 mmol/L (ref 3.5–5.1)
Sodium: 138 mmol/L (ref 135–145)

## 2023-03-23 LAB — CBC
HCT: 42.1 % (ref 39.0–52.0)
Hemoglobin: 14.1 g/dL (ref 13.0–17.0)
MCH: 28.6 pg (ref 26.0–34.0)
MCHC: 33.5 g/dL (ref 30.0–36.0)
MCV: 85.4 fL (ref 80.0–100.0)
Platelets: 255 10*3/uL (ref 150–400)
RBC: 4.93 MIL/uL (ref 4.22–5.81)
RDW: 12.2 % (ref 11.5–15.5)
WBC: 6.6 10*3/uL (ref 4.0–10.5)
nRBC: 0 % (ref 0.0–0.2)

## 2023-03-23 MED ORDER — TICAGRELOR 90 MG PO TABS
90.0000 mg | ORAL_TABLET | Freq: Two times a day (BID) | ORAL | 1 refills | Status: AC
  Filled 2023-03-23 (×2): qty 60, 30d supply, fill #0

## 2023-03-23 MED ORDER — METOPROLOL SUCCINATE ER 25 MG PO TB24
25.0000 mg | ORAL_TABLET | Freq: Every day | ORAL | 1 refills | Status: AC
  Filled 2023-03-23 (×3): qty 30, 30d supply, fill #0

## 2023-03-23 MED ORDER — ASPIRIN 81 MG PO CHEW
81.0000 mg | CHEWABLE_TABLET | Freq: Every day | ORAL | 0 refills | Status: DC
  Filled 2023-03-23 (×2): qty 30, 30d supply, fill #0

## 2023-03-23 NOTE — Discharge Summary (Signed)
Discharge Summary       Patient ID: Edward Harmon MRN: [id number] DOB/AGE: 1955-10-18 67 y.o.  Admit date: 03/22/2023 Discharge date: 03/23/2023  Primary Discharge Diagnosis Coronary artery disease Secondary Discharge Diagnosis S/p coronary stent placement  Significant Diagnostic Studies: left heart catheterization  Consults: None  Hospital Course: The patient was brought to the cardiac cath lab and underwent left heart catheterization and coronary angiography with Dr. Juliann Pares on 03/22/2023. The patient tolerated with procedure well without complications.  The patient received 3.0 x 12 mm frontier Onyx stent to the proximal RCA. On 03/23/2023 the right wrist access site was examined and found to be without significant erythema, tenderness to palpation, or apparent aneurysm. Hospital course was overall uneventful, on day of discharge the patient was ambulatory and eager to go home.  Discussed cardiac rehab and new prescription medications in detail. The patient was given aftercare instructions and ER return precautions. Will arrange for follow up in office in 1 week, or sooner if needed.      Discharge Exam: Blood pressure 139/78, pulse 74, temperature 98.3 F (36.8 C), resp. rate 17, height 5\' 9"  (1.753 m), weight 104.5 kg, SpO2 100%.    General: Well appearing male, well nourished, in no acute distress sitting upright in hospital bed with wife present at bedside.  HEENT:  Normocephalic and atraumatic.  Neck:   No JVD.  Lungs: Normal respiratory effort on room air.  Clear to ascultation bilaterally. Heart: HRRR . Normal S1 and S2 without gallops or murmurs.  Abdomen: non-distended appearing.  Msk: Normal strength and tone for age. Extremities: No peripheral edema. R radial access site without bleeding, significant tenderness to palpation, apparent aneurysm, or significant ecchymosis. Covered with clean gauze and tegaderm dressing.  Neuro: Alert and oriented x3 Psych:  calm and  cooperative.   Labs:   Lab Results  Component Value Date   WBC 6.6 03/23/2023   HGB 14.1 03/23/2023   HCT 42.1 03/23/2023   MCV 85.4 03/23/2023   PLT 255 03/23/2023    Recent Labs  Lab 03/23/23 0745  NA 138  K 4.3  CL 103  CO2 26  BUN 20  CREATININE 1.38*  CALCIUM 8.9  GLUCOSE 117*      Radiology: None EKG: NSR rate 68 bpm Telemetry: Sinus rhythm rate 70s  FOLLOW UP PLANS AND APPOINTMENTS Discharge Instructions     AMB Referral to Cardiac Rehabilitation - Phase II   Complete by: As directed    Diagnosis: Coronary Stents   After initial evaluation and assessments completed: Virtual Based Care may be provided alone or in conjunction with Phase 2 Cardiac Rehab based on patient barriers.: Yes      Allergies as of 03/23/2023   No Known Allergies      Medication List     STOP taking these medications    cholecalciferol 25 MCG (1000 UNIT) tablet Commonly known as: VITAMIN D3   sildenafil 100 MG tablet Commonly known as: VIAGRA       TAKE these medications    Accu-Chek Guide test strip Generic drug: glucose blood See admin instructions.   Accu-Chek Softclix Lancets lancets as directed.   amLODipine 10 MG tablet Commonly known as: NORVASC Take 10 mg by mouth at bedtime.   aspirin EC 81 MG tablet Take 1 tablet (81 mg total) by mouth daily. Swallow whole.   aspirin 81 MG chewable tablet Chew 1 tablet (81 mg total) by mouth daily. Start taking on: March 24, 2023  atorvastatin 40 MG tablet Commonly known as: LIPITOR Take 40 mg by mouth at bedtime.   FreeStyle Iantha 2 Reader [employer] Use 1 Device as directed   [employer] 2 Sensor Misc USE 1 KIT FOR GLUCOSE MONITORING   glimepiride 2 MG tablet Commonly known as: AMARYL Take 2 mg by mouth 2 (two) times daily.   metoprolol succinate 25 MG 24 hr tablet Commonly known as: TOPROL-XL Take 1 tablet (25 mg total) by mouth daily. Start taking on: March 24, 2023   ticagrelor 90 MG Tabs  tablet Commonly known as: BRILINTA Take 1 tablet (90 mg total) by mouth 2 (two) times daily.   ticagrelor 90 MG Tabs tablet Commonly known as: BRILINTA Take 1 tablet (90 mg total) by mouth 2 (two) times daily.        Follow-up Information     Custovic, Rozell Searing, DO. Go in 1 week(s).   Specialty: Cardiology Contact information: [address] [place] [address] [phone]                 PLEASE BRING ALL MEDICATIONS WITH YOU TO FOLLOW UP APPOINTMENTS  Time spent with patient: 72 Signed:  Gale Journey PA-C 03/23/2023, 8:47 AM [facility]

## 2023-03-23 NOTE — Plan of Care (Signed)
  Problem: Activity: Goal: Ability to return to baseline activity level will improve Outcome: Progressing   Problem: Cardiovascular: Goal: Ability to achieve and maintain adequate cardiovascular perfusion will improve Outcome: Progressing Goal: Vascular access site(s) Level 0-1 will be maintained Outcome: Progressing   Problem: Education: Goal: Knowledge of General Education information will improve Description: Including pain rating scale, medication(s)/side effects and non-pharmacologic comfort measures Outcome: Progressing

## 2023-03-23 NOTE — Progress Notes (Signed)
PIV removed. AVS reviewed. Medications delivered to bedside. Patient and wife verbalize understanding of necessary medication regimen and follow up appointments.

## 2023-03-24 LAB — LIPOPROTEIN A (LPA): Lipoprotein (a): 94.2 nmol/L — ABNORMAL HIGH (ref <75.0)

## 2023-04-08 ENCOUNTER — Other Ambulatory Visit: Payer: Federal, State, Local not specified - PPO

## 2023-04-09 ENCOUNTER — Encounter: Payer: Self-pay | Admitting: Urology

## 2023-04-09 ENCOUNTER — Inpatient Hospital Stay: Payer: Federal, State, Local not specified - PPO | Attending: Radiation Oncology

## 2023-04-09 DIAGNOSIS — C61 Malignant neoplasm of prostate: Secondary | ICD-10-CM

## 2023-04-09 LAB — PSA: Prostatic Specific Antigen: 1.87 ng/mL (ref 0.00–4.00)

## 2023-04-15 ENCOUNTER — Ambulatory Visit
Admission: RE | Admit: 2023-04-15 | Discharge: 2023-04-15 | Disposition: A | Discharge status: Home / Self Care | Payer: Federal, State, Local not specified - PPO | Source: Ambulatory Visit | Attending: Radiation Oncology | Admitting: Radiation Oncology

## 2023-04-15 ENCOUNTER — Other Ambulatory Visit: Payer: Self-pay | Admitting: *Deleted

## 2023-04-15 ENCOUNTER — Encounter: Payer: Self-pay | Admitting: Urology

## 2023-04-15 ENCOUNTER — Ambulatory Visit: Payer: Federal, State, Local not specified - PPO | Admitting: Urology

## 2023-04-15 ENCOUNTER — Encounter: Payer: Self-pay | Admitting: *Deleted

## 2023-04-15 ENCOUNTER — Encounter: Payer: Self-pay | Admitting: Radiation Oncology

## 2023-04-15 VITALS — BP 146/90 | HR 90 | Temp 97.1°F | Resp 14 | Ht 69.5 in | Wt 235.2 lb

## 2023-04-15 DIAGNOSIS — Z923 Personal history of irradiation: Secondary | ICD-10-CM | POA: Insufficient documentation

## 2023-04-15 DIAGNOSIS — C61 Malignant neoplasm of prostate: Secondary | ICD-10-CM

## 2023-04-15 DIAGNOSIS — N529 Male erectile dysfunction, unspecified: Secondary | ICD-10-CM

## 2023-04-15 NOTE — Progress Notes (Signed)
Radiation Oncology Follow up Note  Name: Edward Harmon   Date:   04/15/2023 MRN:  [id number] DOB: 20-Jan-1956    This 67 y.o. male presents to the clinic today for 9-month follow-up status post I-125 interstitial implant for Gleason 7 (3+4) adenocarcinoma presented with a PSA of 15.  REFERRING PROVIDER: Carren Rang, PA-C  HPI: Patient is a 67 year old male now out 22 months having completed I-125 interstitial implant for Gleason 7 adenocarcinoma of the prostate presenting with a PSA of 15 seen today in follow-up he is doing well.  He specifically denies any increased lower urinary tract symptoms diarrhea.Or fatigue.  His most recent PSA is 1.87 stable from 4 months ago.  COMPLICATIONS OF TREATMENT: none  FOLLOW UP COMPLIANCE: keeps appointments   PHYSICAL EXAM:  BP (!) 146/90   Pulse 90   Temp (!) 97.1 F (36.2 C)   Resp 14   Ht 5' 9.5" (1.765 m)   Wt 235 lb 3.2 oz (106.7 kg)   BMI 34.24 kg/m  Well-developed well-nourished patient in NAD. HEENT reveals PERLA, EOMI, discs not visualized.  Oral cavity is clear. No oral mucosal lesions are identified. Neck is clear without evidence of cervical or supraclavicular adenopathy. Lungs are clear to A&P. Cardiac examination is essentially unremarkable with regular rate and rhythm without murmur rub or thrill. Abdomen is benign with no organomegaly or masses noted. Motor sensory and DTR levels are equal and symmetric in the upper and lower extremities. Cranial nerves II through XII are grossly intact. Proprioception is intact. No peripheral adenopathy or edema is identified. No motor or sensory levels are noted. Crude visual fields are within normal range.  RADIOLOGY RESULTS: No current films for review  PLAN: Present time patient is doing well.  His PSA is stable.  We will keep an eye on that I have asked to see him back in 6 months for follow-up with repeat PSA.  He continues follow-up care with urology.  Patient knows to call with any  concerns.  I would like to take this opportunity to thank you for allowing me to participate in the care of your patient.Carmina Miller, MD

## 2023-04-15 NOTE — Progress Notes (Signed)
04/15/2023 2:11 PM   Edward Harmon 04-02-1956 [id number]  Reason for visit: Follow up prostate cancer, erectile dysfunction  HPI: He is a 67 year old male who was originally diagnosed with low risk prostate cancer and a prostate MRI in June 2021 showed a PI-RADS 4 lesion and I recommended fusion biopsy, but he was lost to follow-up during the COVID pandemic.  PSA continued to rise and was 15.4, and he underwent a fusion biopsy in [place] in June 2022 that showed a 37 g prostate with favorable intermediate risk prostate cancer in the ROI.   He was taking Cialis at baseline 5 to 10 mg as needed for ED, and had a urinary symptoms of frequency during the day prior to undergoing any treatment for prostate cancer.  He was ultimately treated with 6 months of ADT(injection given 01/23/2021) and brachytherapy performed by Dr. Aggie Cosier and myself on 03/28/2021.  He had a very hard time after surgery with fatigue, hot flashes, weakness, dizziness.  I was not aware of these issues.  He was seen in the ER on 04/26/2021 and work-up revealed uncontrolled diabetes with blood sugar of 674 and hemoglobin A1c of 12.1.  Most recent hemoglobin A1c from from April 2024 improved to 6.3.  He was seen in the ER on 11/06/2022 for severe dysuria, urinalysis showed many bacteria but was otherwise benign, and he was treated with 10 days of Bactrim for possible UTI/prostatitis.  Urine culture was ultimately negative, but his symptoms resolved on the antibiotics.    He denies any urinary symptoms today, no gross hematuria, bone pain, or weight loss.  In terms of erections, he has not used the sildenafil recently, as he recently had a cardiac stent placed in September 2024.  I reviewed those notes from cardiology.  He does not take nitrates, we discussed the importance of not combining PDE 5 inhibitors and nitrates.  He is unsure if he would be interested in penile injections.  When he is a few months out from his cardiac  stent, could consider a re-trial of the 100 mg sildenafil on demand.  PSA nadir after brachytherapy was 0.28 in February 2023,  PSA 12/03/2022 was 1.9 which had increased from 1.1 in December 2023, however this was shortly after his possible UTI/prostatitis.  We discussed the concept of a PSA bounce after brachytherapy and the need to continue to monitor the PSA.  Most recent PSA relatively stable at 1.87 from October 2024.  Continue sildenafil 100 mg on demand, okay to set up for intracavernosal injections in the future if he desires with PA RTC 6 months PSA prior, symptom check  Sondra Come, MD   [facility] [address], [address] [place], [place] [address] [phone]

## 2023-04-15 NOTE — Patient Instructions (Signed)
Take 100 mg of sildenafil on an empty stomach 30 to 45 minutes prior to sexual activity  If this is not working, and you are interested in penile injections, reach out to our clinic and we will send in a test dose to your pharmacy which you can pick up and bring to clinic and we will teach you how to use those.

## 2023-04-22 NOTE — Plan of Care (Signed)
[facility] Tonsillectomy/Adenoidectomy, Postoperative PEDS care plan entered in error.

## 2023-07-11 ENCOUNTER — Emergency Department
Admission: EM | Admit: 2023-07-11 | Discharge: 2023-07-11 | Disposition: A | Discharge status: Home / Self Care | Payer: Federal, State, Local not specified - PPO | Attending: Emergency Medicine | Admitting: Emergency Medicine

## 2023-07-11 ENCOUNTER — Emergency Department: Payer: Federal, State, Local not specified - PPO

## 2023-07-11 DIAGNOSIS — E119 Type 2 diabetes mellitus without complications: Secondary | ICD-10-CM | POA: Insufficient documentation

## 2023-07-11 DIAGNOSIS — R81 Glycosuria: Secondary | ICD-10-CM | POA: Diagnosis not present

## 2023-07-11 DIAGNOSIS — Z8546 Personal history of malignant neoplasm of prostate: Secondary | ICD-10-CM | POA: Insufficient documentation

## 2023-07-11 DIAGNOSIS — I10 Essential (primary) hypertension: Secondary | ICD-10-CM | POA: Diagnosis not present

## 2023-07-11 DIAGNOSIS — R300 Dysuria: Secondary | ICD-10-CM | POA: Diagnosis present

## 2023-07-11 LAB — CBC WITH DIFFERENTIAL/PLATELET
Abs Immature Granulocytes: 0.02 10*3/uL (ref 0.00–0.07)
Basophils Absolute: 0 10*3/uL (ref 0.0–0.1)
Basophils Relative: 0 %
Eosinophils Absolute: 0 10*3/uL (ref 0.0–0.5)
Eosinophils Relative: 1 %
HCT: 43.3 % (ref 39.0–52.0)
Hemoglobin: 14.6 g/dL (ref 13.0–17.0)
Immature Granulocytes: 0 %
Lymphocytes Relative: 25 %
Lymphs Abs: 1.9 10*3/uL (ref 0.7–4.0)
MCH: 29 pg (ref 26.0–34.0)
MCHC: 33.7 g/dL (ref 30.0–36.0)
MCV: 85.9 fL (ref 80.0–100.0)
Monocytes Absolute: 0.7 10*3/uL (ref 0.1–1.0)
Monocytes Relative: 10 %
Neutro Abs: 4.8 10*3/uL (ref 1.7–7.7)
Neutrophils Relative %: 64 %
Platelets: 291 10*3/uL (ref 150–400)
RBC: 5.04 MIL/uL (ref 4.22–5.81)
RDW: 12.5 % (ref 11.5–15.5)
WBC: 7.5 10*3/uL (ref 4.0–10.5)
nRBC: 0 % (ref 0.0–0.2)

## 2023-07-11 LAB — URINALYSIS, ROUTINE W REFLEX MICROSCOPIC
Bilirubin Urine: NEGATIVE
Glucose, UA: 50 mg/dL — AB
Hgb urine dipstick: NEGATIVE
Ketones, ur: NEGATIVE mg/dL
Leukocytes,Ua: NEGATIVE
Nitrite: NEGATIVE
Protein, ur: NEGATIVE mg/dL
Specific Gravity, Urine: 1.019 (ref 1.005–1.030)
pH: 5 (ref 5.0–8.0)

## 2023-07-11 LAB — BASIC METABOLIC PANEL
Anion gap: 14 (ref 5–15)
BUN: 18 mg/dL (ref 8–23)
CO2: 24 mmol/L (ref 22–32)
Calcium: 9.2 mg/dL (ref 8.9–10.3)
Chloride: 101 mmol/L (ref 98–111)
Creatinine, Ser: 1.27 mg/dL — ABNORMAL HIGH (ref 0.61–1.24)
GFR, Estimated: >60 mL/min (ref >60)
Glucose, Bld: 96 mg/dL (ref 70–99)
Potassium: 3.9 mmol/L (ref 3.5–5.1)
Sodium: 139 mmol/L (ref 135–145)

## 2023-07-11 MED ORDER — SULFAMETHOXAZOLE-TRIMETHOPRIM 800-160 MG PO TABS
1.0000 | ORAL_TABLET | Freq: Two times a day (BID) | ORAL | 0 refills | Status: AC
Start: 2023-07-11 — End: 2023-07-21

## 2023-07-11 MED ORDER — IOHEXOL 300 MG/ML  SOLN
100.0000 mL | Freq: Once | INTRAMUSCULAR | Status: AC | PRN
  Administered 2023-07-11: 100 mL via INTRAVENOUS

## 2023-07-11 NOTE — ED Triage Notes (Signed)
 Pt c/o dysuria x5 days.  Pain score 3/10.  Hx of prostate CA and had radiation seeds placed around 1 year ago.

## 2023-07-11 NOTE — ED Provider Notes (Signed)
 [facility] Provider Note    Event Date/Time   First MD Initiated Contact with Patient 07/11/23 1543     (approximate)   History   Dysuria   HPI  Edward Harmon is a 68 y.o. male with a past medical history of type 2 diabetes, prostate cancer, hypertension, hyperlipidemia presents today for evaluation of burning with urination for the past 3 days.  Patient also reports that 2 to 3 days ago he had a couple of seconds of right sided flank pain which resolved and has not recurred.  He reports that he has burning around his urethral meatus when he pees only.  He has not noticed any skin changes.  He has not had any nausea or vomiting.  He has not had any fevers or chills.  Patient Active Problem List   Diagnosis Date Noted   S/P drug eluting coronary stent placement 03/22/2023   Type 2 diabetes mellitus with hyperglycemia, without long-term current use of insulin  (HCC) 04/29/2021   Prostate cancer (HCC) 10/25/2019   Elevated PSA 04/26/2019   Essential hypertension 04/26/2019   Hyperlipidemia, mixed 04/26/2019   Prediabetes 04/26/2019          Physical Exam   Triage Vital Signs: ED Triage Vitals [07/11/23 1205]  Encounter Vitals Group     BP (!) 158/84     Systolic BP Percentile      Diastolic BP Percentile      Pulse Rate 89     Resp 16     Temp 98.9 F (37.2 C)     Temp Source Oral     SpO2 96 %     Weight 235 lb (106.6 kg)     Height 5' 9 (1.753 m)     Head Circumference      Peak Flow      Pain Score 3     Pain Loc      Pain Education      Exclude from Growth Chart     Most recent vital signs: Vitals:   07/11/23 1205  BP: (!) 158/84  Pulse: 89  Resp: 16  Temp: 98.9 F (37.2 C)  SpO2: 96%    Physical Exam Vitals and nursing note reviewed.  Constitutional:      General: Awake and alert. No acute distress.    Appearance: Normal appearance. The patient is normal weight.  HENT:     Head: Normocephalic and atraumatic.      Mouth: Mucous membranes are moist.  Eyes:     General: PERRL. Normal EOMs        Right eye: No discharge.        Left eye: No discharge.     Conjunctiva/sclera: Conjunctivae normal.  Cardiovascular:     Rate and Rhythm: Normal rate and regular rhythm.     Pulses: Normal pulses.  Pulmonary:     Effort: Pulmonary effort is normal. No respiratory distress.     Breath sounds: Normal breath sounds.  Abdominal:     Abdomen is soft. There is no abdominal tenderness. No rebound or guarding. No distention. No CVAT Musculoskeletal:        General: No swelling. Normal range of motion.     Cervical back: Normal range of motion and neck supple.  Skin:    General: Skin is warm and dry.     Capillary Refill: Capillary refill takes less than 2 seconds.     Findings: No rash.  Neurological:  Mental Status: The patient is awake and alert.      ED Results / Procedures / Treatments   Labs (all labs ordered are listed, but only abnormal results are displayed) Labs Reviewed  URINALYSIS, ROUTINE W REFLEX MICROSCOPIC - Abnormal; Notable for the following components:      Result Value   Color, Urine YELLOW (*)    APPearance CLEAR (*)    Glucose, UA 50 (*)    All other components within normal limits  BASIC METABOLIC PANEL - Abnormal; Notable for the following components:   Creatinine, Ser 1.27 (*)    All other components within normal limits  URINE CULTURE  CBC WITH DIFFERENTIAL/PLATELET     EKG     RADIOLOGY I independently reviewed and interpreted imaging and agree with radiologists findings.     PROCEDURES:  Critical Care performed:   Procedures   MEDICATIONS ORDERED IN ED: Medications  iohexol  (OMNIPAQUE ) 300 MG/ML solution 100 mL (100 mLs Intravenous Contrast Given 07/11/23 1719)     IMPRESSION / MDM / ASSESSMENT AND PLAN / ED COURSE  I reviewed the triage vital signs and the nursing notes.   Differential diagnosis includes, but is not limited to, UTI,  hyperglycemia, prostatitis.  I reviewed the patient's chart.  Patient follows with Dr. Francisca, most recently saw on 04/15/2023.  He was seen in the ER on 11/06/2022 for dysuria and urinalysis showed many bacteria but was otherwise benign, he was treated with Bactrim  for possible UTI/prostatitis, and urine culture was ultimately negative but his symptoms resolved on the antibiotics.  He also follows with Dr. Marcey Penton with radiation oncology, also saw him on 04/15/2023 with no further treatment plans besides following his PSA which has remained stable.   Patient is awake and alert, hemodynamically stable and afebrile.  He reports that he feels his normal self besides burning with urination.  He has no abdominal tenderness or flank pain.  Given his history, further workup is indicated.  Labs obtained are overall reassuring, creatinine is at his baseline.  CT scan of his abdomen and pelvis does not reveal any acute intra-abdominal pathology.  His urinalysis does not suggest infection, has mild glucose, though he has a normal serum glucose.  It is possible that he has urethral irritation from his glucose urea the patient is quite certain that his symptoms feel the same as when he had a urinary tract infection in the past and it was treated with Bactrim .  Discussed the option of waiting for the urine culture and treating with antibiotics if urine culture is positive for infection, versus treating with antibiotics now for possible early prostatitis or UTI, and patient and his wife would prefer to start antibiotics now.  He was instructed to follow-up with Dr. Francisca for further management.  We discussed strict return precautions in the meantime.  Patient understands and agrees with plan.  He was discharged in stable condition.  Case was discussed with Dr. Arlander who agrees with assessment and plan.  Patient's presentation is most consistent with acute complicated illness / injury requiring diagnostic  workup.     FINAL CLINICAL IMPRESSION(S) / ED DIAGNOSES   Final diagnoses:  Dysuria  Glucosuria with normal serum glucose     Rx / DC Orders   ED Discharge Orders          Ordered    sulfamethoxazole -trimethoprim  (BACTRIM  DS) 800-160 MG tablet  2 times daily        07/11/23 1808  Note:  This document was prepared using Dragon voice recognition software and may include unintentional dictation errors.   Achaia Garlock E, PA-C 07/11/23 1816    Arlander [place], MD 07/11/23 RONOLD

## 2023-07-11 NOTE — ED Provider Triage Note (Signed)
 Emergency Medicine Provider Triage Evaluation Note  Edward Harmon , a 68 y.o. male  was evaluated in triage.  Pt complains of dysuria x 5 days. History of prostate cancer. No known fever.  Physical Exam  BP (!) 158/84 (BP Location: Right Arm)   Pulse 89   Temp 98.9 F (37.2 C) (Oral)   Resp 16   SpO2 96%  Gen:   Awake, no distress   Resp:  Normal effort  MSK:   Moves extremities without difficulty  Other:    Medical Decision Making  Medically screening exam initiated at 12:05 PM.  Appropriate orders placed.  Edward Harmon was informed that the remainder of the evaluation will be completed by another provider, this initial triage assessment does not replace that evaluation, and the importance of remaining in the ED until their evaluation is complete.     Herlinda Kirk NOVAK, FNP 07/11/23 1206

## 2023-07-11 NOTE — Discharge Instructions (Signed)
 Please follow-up with your urologist.  You may take the antibiotics as prescribed.  Please return for any new, worsening, or change in symptoms or other concerns.  It was a pleasure caring for you today.

## 2023-07-12 ENCOUNTER — Other Ambulatory Visit: Payer: Self-pay | Admitting: Urology

## 2023-07-12 DIAGNOSIS — N529 Male erectile dysfunction, unspecified: Secondary | ICD-10-CM

## 2023-07-12 LAB — URINE CULTURE: Culture: NO GROWTH

## 2023-07-30 ENCOUNTER — Telehealth: Payer: Self-pay | Admitting: Urology

## 2023-07-30 NOTE — Telephone Encounter (Signed)
error 

## 2023-08-31 ENCOUNTER — Other Ambulatory Visit
Admission: RE | Admit: 2023-08-31 | Discharge: 2023-08-31 | Disposition: A | Discharge status: Home / Self Care | Attending: Urology | Admitting: Urology

## 2023-08-31 ENCOUNTER — Ambulatory Visit: Payer: Federal, State, Local not specified - PPO | Admitting: Urology

## 2023-08-31 VITALS — BP 156/76 | HR 96 | Ht 69.0 in | Wt 233.8 lb

## 2023-08-31 DIAGNOSIS — R300 Dysuria: Secondary | ICD-10-CM

## 2023-08-31 DIAGNOSIS — N5235 Erectile dysfunction following radiation therapy: Secondary | ICD-10-CM | POA: Diagnosis not present

## 2023-08-31 DIAGNOSIS — C61 Malignant neoplasm of prostate: Secondary | ICD-10-CM

## 2023-08-31 DIAGNOSIS — Z87898 Personal history of other specified conditions: Secondary | ICD-10-CM | POA: Diagnosis not present

## 2023-08-31 LAB — PSA: Prostatic Specific Antigen: 0.99 ng/mL (ref 0.00–4.00)

## 2023-08-31 MED ORDER — NONFORMULARY OR COMPOUNDED ITEM
0 refills | Status: DC
Start: 2023-08-31 — End: 2023-08-31

## 2023-08-31 MED ORDER — NONFORMULARY OR COMPOUNDED ITEM
0 refills | Status: DC
Start: 2023-08-31 — End: 2023-10-21

## 2023-08-31 NOTE — Patient Instructions (Signed)
Trimix Injection Training  The provider will prescribe the Trimix medication to [facility].  You will need to pick up the medication at:   [address]  [place], [place] [address] [phone]  Once you have the medication in hand you will need to call our office to schedule a morning ONLY appointment to have injection teaching. The medication will induce an erection. The medication is estimated at $80.00. This medication is time sensitive as it expires quickly.   You will need to have a normal blood pressure in order to be injected. Your blood pressure must be under 140/90. We will check your blood pressure multiple times to ensure the reading is correct, if you BP continues to be elevated we will need to reschedule your appointment.        

## 2023-08-31 NOTE — Progress Notes (Signed)
   08/31/2023 11:26 AM   Edward Harmon [birth year] [id number]  Reason for visit: Follow up prostate cancer, ED, dysuria  HPI: 68 year old male with favorable intermediate risk prostate cancer diagnosed in June 2022 and treated with brachytherapy in September 2022(7-month ADT injection given in July 2022).  Initial postop PSA was 0.28 from February 2023.  PSA has remained low, most recently 1.87 from October 2024, stable from 1.9 in June 2024.  PSA today is pending.  His medical history is also notable for diabetes as well as CAD, including a cardiac stent placement September 2024.  In terms of ED, was using Cialis 5 to 10 mg as needed prior to undergoing treatment for prostate cancer.  He has tried both Cialis and Viagra with minimal improvement over the last few months.  I had previously recommended considering penile injections.  He is at the point where he is interested in trying penile injections and risk and benefits were discussed.  Test dose was sent in and he will be scheduled with our PA for teaching.  He was also recently seen in the ER in January 2025 for dysuria.  Urine culture was negative and he was treated with Bactrim for possible prostatitis with improvement in his urinary symptoms.  He also had a CT at that time, and I personally viewed and interpreted those images that show no hydronephrosis or stones, decompressed bladder, brachytherapy seeds in the prostate.  Unclear if his intermittent dysuria is related to radiation cystitis or prostatitis, but symptoms do seem to have resolved with courses of Bactrim.  Test dose Trimix sent to pharmacy, RTC with PA for teaching PSA today, call with results, if remains stable can repeat PSA lab in 6 months   Sondra Come, MD  [facility] [address], [address] [place], [place] [address] [phone]

## 2023-09-01 ENCOUNTER — Telehealth: Payer: Self-pay

## 2023-09-01 DIAGNOSIS — C61 Malignant neoplasm of prostate: Secondary | ICD-10-CM

## 2023-09-01 NOTE — Telephone Encounter (Signed)
-----   Message from Sondra Come sent at 09/01/2023  8:08 AM EST ----- Randie Heinz news, PSA continues to decrease at 0.99, no evidence of recurrence of prostate cancer.  Keep follow-up as scheduled for Trimix teaching  RTC 6 months PSA lab and call with those results  Legrand Rams, MD 09/01/2023

## 2023-09-01 NOTE — Telephone Encounter (Signed)
Called pt informed him of the information below. Pt voiced understanding. Appt scheduled. Lab ordered.  

## 2023-09-07 NOTE — Progress Notes (Deleted)
   09/07/2023 8:49 PM  DESHAUN WEISINGER 09-27-1955 [id number]   Referring provider: Carren Rang, PA-C [address] [place],  [place] [address]  Urological history: 1. Prostate cancer -PSA (08/2023) 0.99 -favorable intermediate risk dx (11/2020)  2. Erectile dysfunction -contributing factors of age, prostate cancer, pelvic radiation, HTN, DM, HLD, CAD, CKD anemia -failed PDE5i's  No chief complaint on file.  HPI: Edward Harmon is a 68 y.o. male who presents today for Trimix ICI.    Previous records reviewed.     The procedure is discussed with patient.  He is allowed to ask questions Questions were answered to his satisfaction.  We were able to proceed to the titration.  Physical Exam:  There were no vitals taken for this visit.  Constitutional:  Well nourished. Alert and oriented, No acute distress. GU: No CVA tenderness.  No bladder fullness or masses.  Patient with circumcised/uncircumcised phallus. ***Foreskin easily retracted***  Urethral meatus is patent.  No penile discharge. No penile lesions or rashes.  Psychiatric: Normal mood and affect.   Procedure *** Patient's left corpus cavernosum is identified.  An area near the base of the penis is cleansed with rubbing alcohol.  Careful to avoid the dorsal vein, 2 mcg of Trimix (papaverine 30 mg, phentolamine 1 mg and prostaglandin E1 10 mcg, Lot # ***@*** exp # *** is injected at a 90 degree angle into the left *** corpus cavernosum near the base of the penis.  Patient experienced a very firm erection in 15 minutes.    Patient's right corpus cavernosum is identified.  An area near the base of the penis is cleansed with rubbing alcohol.  Careful to avoid the dorsal vein,  2 mcg of Trimix (papaverine 30 mg, phentolamine 1 mg and prostaglandin E1 10 mcg, Lot # ***@*** exp # *** is injected at a 90 degree angle into the right corpus cavernosum near the base of the penis.  Patient experienced a very firm  erection in 15 minutes.    Patient's left corpus cavernosum is identified.  An area near the base of the penis is cleansed with rubbing alcohol.  Careful to avoid the dorsal vein, 2 mcg of Trimix (papaverine 30 mg, phentolamine 1 mg and prostaglandin E1 10 mcg, Lot # ***@*** exp # *** is injected at a 90 degree angle into the left *** corpus cavernosum near the base of the penis.  Patient experienced a very firm erection in 15 minutes.    Patient's right corpus cavernosum is identified.  An area near the base of the penis is cleansed with rubbing alcohol.  Careful to avoid the dorsal vein, 2 mcg of Trimix (papaverine 30 mg, phentolamine 1 mg and prostaglandin E1 10 mcg, Lot # ***@*** exp # *** is injected at a 90 degree angle into the right corpus cavernosum near the base of the penis.  Patient experienced a very firm erection in 15 minutes.     Assessment & Plan:    1.  Erectile dysfunction -Advised patient of the condition of priapism, painful erection lasting for more than four hours, and to contact the office or seek treatment in the ED immediately   2. Prostate cancer -follow up scheduled with Dr. Richardo Hanks in September    No follow-ups on file.  Michiel Cowboy, PA-C   [facility] [address] [place], [place] [address] [phone]

## 2023-09-09 ENCOUNTER — Ambulatory Visit: Admitting: Urology

## 2023-09-09 DIAGNOSIS — N529 Male erectile dysfunction, unspecified: Secondary | ICD-10-CM

## 2023-09-09 DIAGNOSIS — C61 Malignant neoplasm of prostate: Secondary | ICD-10-CM

## 2023-09-17 ENCOUNTER — Ambulatory Visit: Admitting: Urology

## 2023-09-17 NOTE — Progress Notes (Unsigned)
   09/20/2023 12:14 PM  Edward Harmon Apr 09, 1956 [id number]   Referring provider: Carren Rang, PA-C [address] [place],  [place] [address]  Urological history: 1. Prostate cancer -PSA (08/2023) 0.99 -favorable intermediate risk dx (11/2020)  2. Erectile dysfunction -contributing factors of age, prostate cancer, pelvic radiation, HTN, DM, HLD, CAD, CKD anemia -failed PDE5i's  Chief Complaint  Patient presents with   Erectile Dysfunction    Trimix training   HPI: Edward Harmon is a 68 y.o. male who presents today for Trimix ICI with his wife, Tammy.   Previous records reviewed.     The procedure is discussed with patient.  He is allowed to ask questions. Questions were answered to his satisfaction.  We were able to proceed to the titration.  Physical Exam:  BP (!) 141/80 (BP Location: Left Arm, Patient Position: Sitting, Cuff Size: Large)   Pulse 71   Ht 5\' 9"  (1.753 m)   Wt 233 lb (105.7 kg)   BMI 34.41 kg/m   Constitutional:  Well nourished. Alert and oriented, No acute distress. GU: No CVA tenderness.  No bladder fullness or masses.  Patient with uncircumcised phallus. Foreskin easily retracted.  Urethral meatus is patent.  No penile discharge. No penile lesions or rashes.  Psychiatric: Normal mood and affect.   Procedure  Patient's left corpus cavernosum is identified.  An area near the base of the penis is cleansed with rubbing alcohol.  Careful to avoid the dorsal vein, 2 mcg of Trimix (papaverine 30 mg, phentolamine 1 mg and prostaglandin E1 10 mcg, Lot # [id number]  exp # [id number] is injected at a 90 degree angle into the left  corpus cavernosum near the base of the penis.  Patient experienced a very firm erection in 15 minutes.    Assessment & Plan:    1.  Erectile dysfunction -He and his wife returned home and had satisfactory intercourse, he states the erection lasted for about an hour -he will alternate with the Trimix and  Viagra to see to if he still needs the Trimix, his wife feels it may be more of a confidence issue  -they will call next week and report status  -Advised patient of the condition of priapism, painful erection lasting for more than four hours, and to contact the office or seek treatment in the ED immediately   2. Prostate cancer -follow up scheduled with Dr. Richardo Hanks in September   Return for keep follow up with Dr. Richardo Hanks .  Michiel Cowboy, PA-C   [facility] [address] [place], [place] [address] [phone]  I spent 30 minutes on the day of the encounter to include pre-visit record review, face-to-face time with the patient, and post-visit ordering of tests.

## 2023-09-20 ENCOUNTER — Ambulatory Visit: Admitting: Urology

## 2023-09-20 ENCOUNTER — Encounter: Payer: Self-pay | Admitting: Urology

## 2023-09-20 VITALS — BP 141/80 | HR 71 | Ht 69.0 in | Wt 233.0 lb

## 2023-09-20 DIAGNOSIS — N529 Male erectile dysfunction, unspecified: Secondary | ICD-10-CM

## 2023-09-20 DIAGNOSIS — C61 Malignant neoplasm of prostate: Secondary | ICD-10-CM

## 2023-10-11 ENCOUNTER — Telehealth: Payer: Self-pay | Admitting: Urology

## 2023-10-11 ENCOUNTER — Other Ambulatory Visit: Payer: Self-pay

## 2023-10-11 NOTE — Telephone Encounter (Signed)
 Should patient have test dose sent in again as he is coming in for a re-teach or should standard dose be sent in? Please advise.

## 2023-10-11 NOTE — Telephone Encounter (Signed)
 Patient's wife called requesting a new RX to be sent to pharmacy for Trimix as the test does that patient received has expired. Patient's wife states he only used it one time since picking it up. Wife also states that patient is requesting more teaching on using the injection and is scheduled with Cathleen Coach on 11/04/23 at 8:20 am fir this teaching. Please advise patient's wife at [phone].

## 2023-10-12 NOTE — Telephone Encounter (Signed)
 I do not know the answer to this question.  I am deferring to [facility].  She will be back next week and can respond to them then.

## 2023-10-14 ENCOUNTER — Ambulatory Visit: Payer: Self-pay | Admitting: Urology

## 2023-10-21 ENCOUNTER — Other Ambulatory Visit: Payer: Self-pay

## 2023-10-21 DIAGNOSIS — N5235 Erectile dysfunction following radiation therapy: Secondary | ICD-10-CM

## 2023-10-21 MED ORDER — NONFORMULARY OR COMPOUNDED ITEM
0 refills | Status: DC
Start: 2023-10-21 — End: 2024-03-08

## 2023-10-21 NOTE — Telephone Encounter (Signed)
 Pts wife Tammy aware med has been sent in. Reminded to bring to appt on 5/8 in [place].

## 2023-11-04 ENCOUNTER — Encounter: Payer: Self-pay | Admitting: Urology

## 2023-11-04 ENCOUNTER — Ambulatory Visit: Admitting: Urology

## 2023-11-04 VITALS — BP 151/91 | HR 76

## 2023-11-04 DIAGNOSIS — C61 Malignant neoplasm of prostate: Secondary | ICD-10-CM

## 2023-11-04 DIAGNOSIS — N5235 Erectile dysfunction following radiation therapy: Secondary | ICD-10-CM | POA: Diagnosis not present

## 2023-11-04 NOTE — Patient Instructions (Signed)
TRIMIX SELF-INJECTION INSTRUCTIONS    DETAILED PROCEDURE  1. GETTING SET UP  A. Proper hygiene is important. Wash your hands and keep the penis clean.  B. Assemble the following:  - Bottle of Trimix  - Alcohol pad  - Syringe  C. Keep the Trimix cold by returning the bottle to the refrigerator, or by placing the bottle in a cup of ice.   2. PREPARE THE SYRINGE  A. Wipe the rubber top of the vial with an alcohol pad.  B. After removing the cap of the needle, pull the plunger back to the desired dosage, filling this volume with air. Use a new needle and syringe each time.  C. Insert the needle through the rubber top and inject the air into the vial.  D. Turn the vial with needle and syringe inserted upside down. Pull back on the syringe plunger in a slow and steady motion until the desired dosage is achieved.  E. Tap the side of the syringe (1cc tuberculin syringe with a 29 gauge needle) to allow any air bubbles to float towards the needle. Avoid having these air bubbles in the syringe when self-injecting by first injecting out the collected bubbles that may form.  F. Remove the needle from the bottle and replace the protective cap on the needle.    3. SELECT AND PREPARE THE SITE FOR INJECTION  A. The proper location for injection is at the 9-11 and 1-3 o'clock positions, between the base and mid-portion of the penis.(see diagram) Avoid the midline because of potential for injury to the urethra (6 o'clock; for urinary passage) and the penile arteries and nerves (near 12 o'clock). Avoid any visible veins or arteries on the surface.  B. Grasp and pull the head of the penis toward the side of your leg with the index finger and thumb (use the left hand, if right handed). While maintaining light tension, select a site for injection.  C. Clean the site with an alcohol pad.   4: INJECT TRIMIX AND APPLY COMPRESSION  A. With a steady and continuous motion, penetrate the skin with the needle at a 90 o  angle. The needle should then be advanced to the hub. Slight resistance is encountered as the needle passes into the proper position within the erectile tissue (corporeal body).  B. Inject the Trimix over approximately 4 seconds. Withdraw the needle from the penis and apply compression to the injection site for approximately 1 minute. Several minutes of compression may be required to avoid bleeding, especially if you are an aspirin user.  C. Replace the cap on the needle and dispose of properly.   If you experience a painful erection that will not go down, take four (30 mg) tablets of pseudoephedrine (Sudafed-not the extended release) and if the erection does not go down in the next hour or increases in pain, contact the office immediately or seek treatment in the ED    

## 2023-11-04 NOTE — Progress Notes (Signed)
   11/04/2023 8:47 AM  Belvia Boyer 06-Sep-1955 [id number]   Referring provider: Morton Areas, PA-C [address] [place],  [place] [address]  Urological history: 1. Prostate cancer -PSA (08/2023) 0.99 -favorable intermediate risk dx (11/2020)  2. Erectile dysfunction -contributing factors of age, prostate cancer, pelvic radiation, HTN, DM, HLD, CAD, CKD anemia -failed PDE5i's  Chief Complaint  Patient presents with   Follow-up    Trimix teaching   HPI: Edward Harmon is a 68 y.o. male who presents today for Trimix ICI with his wife, Tammy.   Previous records reviewed.     The procedure is discussed with patient.  He is allowed to ask questions. Questions were answered to his satisfaction.  We were able to proceed to the titration.  Their main concern was just having a another instructional visit on how to draw up the Trimix medication and how to inject.  She states that she has been trying it at home and is having trouble with losing the medication and the needles.  They have been using 0.2 cc of Trimix and only achieving a semifirm erection.  Physical Exam:  BP (!) 151/91   Pulse 76   Constitutional:  Well nourished. Alert and oriented, No acute distress. HEENT: [place] AT, moist mucus membranes.  Trachea midline Cardiovascular: No clubbing, cyanosis, or edema. Respiratory: Normal respiratory effort, no increased work of breathing. Neurologic: Grossly intact, no focal deficits, moving all 4 extremities. Psychiatric: Normal mood and affect.   Assessment & Plan:    1.  Erectile dysfunction -I demonstrated how to draw up the medication, how to exchange needles and where to inject.  We believe that she lost the medication 1 point because she pushed on the plunger to change the needle which caused the expelling of the medication.  I demonstrated on how to hold the syringe in the middle to avoid depressing the plunger. -Mr. Skidmore did not want to be  injected at this time, so they will try 0.3 cc at home -I advised them to only increase by 0.1 cc if they do not experience a satisfactory erection and if they get to 0.6 cc and are not experiencing a satisfactory erection, to reach out to me -If they do discover a dose that gives him satisfactory erection, they will call us  and we will [facility] -Advised patient of the condition of priapism, painful erection lasting for more than four hours, and to contact the office or seek treatment in the ED immediately   2. Prostate cancer -follow up scheduled with Dr. Estanislao Heimlich in September   Return for Keep follow up appointment with Dr. Estanislao Heimlich .  Matilde Son, PA-C   [facility] [address] [place], [place] [address] [phone]  I spent 14 minutes on the day of the encounter to include pre-visit record review, face-to-face time with the patient, and post-visit ordering of tests.

## 2024-01-12 ENCOUNTER — Encounter: Payer: Self-pay | Admitting: Urology

## 2024-03-02 ENCOUNTER — Other Ambulatory Visit

## 2024-03-08 ENCOUNTER — Ambulatory Visit: Admitting: Urology

## 2024-03-08 VITALS — BP 156/83 | HR 77 | Wt 235.0 lb

## 2024-03-08 DIAGNOSIS — N5235 Erectile dysfunction following radiation therapy: Secondary | ICD-10-CM | POA: Diagnosis not present

## 2024-03-08 DIAGNOSIS — C61 Malignant neoplasm of prostate: Secondary | ICD-10-CM

## 2024-03-08 MED ORDER — NONFORMULARY OR COMPOUNDED ITEM: 6 refills | Status: AC

## 2024-03-08 NOTE — Progress Notes (Signed)
   03/08/2024 9:29 AM   VIDYUTH BELSITO 18-Jun-1956 [id number]  Reason for visit: Follow up prostate cancer, ED  History: Favorable intermediate risk prostate cancer diagnosed June 2022 Treated with brachytherapy September 2022, 74-month ADT injection given July 2022(had fairly significant side effects and uncontrolled diabetes) PSA nadir 0.26 August 2021 Using Trimix with mixed results for ED   Imaging/labs: Pretreatment PSA 15.4, PSA nadir 0.28 PSA bounce to 1.8 05 April 2023, back to 0.99 March 2025 CT abdomen pelvis with contrast January 2025 benign(ordered for lower abdominal pain)  Today: No significant complaints today, no significant urinary symptoms Had mixed results on the Trimix 0.4 dose, interested in refill and continuing that medication.  He is not interested in penile prosthesis Reports fatigue, interested in having a testosterone  checked  Plan:   Check PSA and testosterone  today, call with results Trimix refilled at 0.4 dose, can follow-up with Clotilda if desires further teaching or dose titration RTC Shannon in 6 months for ED and PSA, RTC with me 1 year PSA prior.  If PSA stable September 2026 can move to yearly PSA monitoring   Redell JAYSON Burnet, MD  [facility] [address], [address] [place], [place] [address] [phone]

## 2024-03-09 ENCOUNTER — Ambulatory Visit: Payer: Self-pay | Admitting: Urology

## 2024-03-09 ENCOUNTER — Ambulatory Visit: Admitting: Urology

## 2024-03-09 LAB — TESTOSTERONE: Testosterone: 290 ng/dL (ref 264–916)

## 2024-03-09 LAB — PSA: Prostate Specific Ag, Serum: 0.4 ng/mL (ref 0.0–4.0)

## 2024-03-09 NOTE — Telephone Encounter (Signed)
-----   Message from Redell JAYSON Burnet sent at 03/09/2024  8:27 AM EDT ----- Good news, PSA continues to decrease at 0.4, testosterone  is within normal range at 290.  Keep follow-up as scheduled  Redell Burnet, MD 03/09/2024  ----- Message ----- From: Interface, [facility] Lab Results In Sent: 03/09/2024   5:37 AM EDT To: Redell JAYSON Burnet, MD

## 2024-03-09 NOTE — Telephone Encounter (Signed)
 Called pt and wife, informed them of the information below. Pt and wife voiced understanding.

## 2024-03-23 ENCOUNTER — Other Ambulatory Visit: Payer: Self-pay | Admitting: Nephrology

## 2024-03-23 DIAGNOSIS — E785 Hyperlipidemia, unspecified: Secondary | ICD-10-CM

## 2024-03-23 DIAGNOSIS — E1122 Type 2 diabetes mellitus with diabetic chronic kidney disease: Secondary | ICD-10-CM

## 2024-04-06 ENCOUNTER — Other Ambulatory Visit: Payer: Federal, State, Local not specified - PPO

## 2024-04-07 ENCOUNTER — Ambulatory Visit
Admission: RE | Admit: 2024-04-07 | Discharge: 2024-04-07 | Disposition: A | Discharge status: Home / Self Care | Source: Ambulatory Visit | Attending: Nephrology | Admitting: Nephrology

## 2024-04-07 DIAGNOSIS — E1122 Type 2 diabetes mellitus with diabetic chronic kidney disease: Secondary | ICD-10-CM | POA: Diagnosis present

## 2024-04-07 DIAGNOSIS — N183 Chronic kidney disease, stage 3 unspecified: Secondary | ICD-10-CM | POA: Diagnosis present

## 2024-04-07 DIAGNOSIS — I129 Hypertensive chronic kidney disease with stage 1 through stage 4 chronic kidney disease, or unspecified chronic kidney disease: Secondary | ICD-10-CM | POA: Diagnosis present

## 2024-04-07 DIAGNOSIS — E785 Hyperlipidemia, unspecified: Secondary | ICD-10-CM | POA: Insufficient documentation

## 2024-04-13 ENCOUNTER — Ambulatory Visit
Admission: RE | Admit: 2024-04-13 | Discharge: 2024-04-13 | Disposition: A | Discharge status: Home / Self Care | Payer: Federal, State, Local not specified - PPO | Source: Ambulatory Visit | Attending: Radiation Oncology | Admitting: Radiation Oncology

## 2024-04-13 ENCOUNTER — Encounter: Payer: Self-pay | Admitting: Radiation Oncology

## 2024-04-13 ENCOUNTER — Encounter: Payer: Self-pay | Admitting: *Deleted

## 2024-04-13 VITALS — BP 138/88 | HR 88 | Temp 97.5°F | Resp 16 | Wt 237.0 lb

## 2024-04-13 DIAGNOSIS — Z923 Personal history of irradiation: Secondary | ICD-10-CM | POA: Insufficient documentation

## 2024-04-13 DIAGNOSIS — C61 Malignant neoplasm of prostate: Secondary | ICD-10-CM | POA: Insufficient documentation

## 2024-04-13 NOTE — Progress Notes (Signed)
 Radiation Oncology Follow up Note  Name: Edward Harmon   Date:   04/13/2024 MRN:  [id number] DOB: 08-12-1955    This 68 y.o. male presents to the clinic today for 3-year follow-up status post I-125 interstitial implant for Gleason 7 (3+4) adenocarcinoma presented with a PSA of 15.  REFERRING PROVIDER: Franchot Houston, PA-C  HPI: Patient is a 68 year old male now out over 3 years having completed I-125 interstitial implant for Gleason 7 (3+4) adenocarcinoma the prostate presenting with a PSA of 15.  Seen today in routine follow-up he is doing well.  He specifically denies any increased lower urinary tract symptoms diarrhea or fatigue his most recent PSA is.  0.04 down from 1.9 a year ago.  COMPLICATIONS OF TREATMENT: none  FOLLOW UP COMPLIANCE: keeps appointments   PHYSICAL EXAM:  BP 138/88   Pulse 88   Temp (!) 97.5 F (36.4 C) (Tympanic)   Resp 16   Wt 237 lb (107.5 kg)   BMI 35.00 kg/m  Well-developed well-nourished patient in NAD. HEENT reveals PERLA, EOMI, discs not visualized.  Oral cavity is clear. No oral mucosal lesions are identified. Neck is clear without evidence of cervical or supraclavicular adenopathy. Lungs are clear to A&P. Cardiac examination is essentially unremarkable with regular rate and rhythm without murmur rub or thrill. Abdomen is benign with no organomegaly or masses noted. Motor sensory and DTR levels are equal and symmetric in the upper and lower extremities. Cranial nerves II through XII are grossly intact. Proprioception is intact. No peripheral adenopathy or edema is identified. No motor or sensory levels are noted. Crude visual fields are within normal range.  RADIOLOGY RESULTS: No current films for review  PLAN: Present time patient is doing well there are excellent biochemical control of his prostate cancer.  I am pleased with his overall progress.  I am going to turn follow-up care over to urology.  I would be happy to reevaluate the patient  anytime should that be indicated.  Patient can also have his PSAs drawn once a year by his PMD.  I would like to take this opportunity to thank you for allowing me to participate in the care of your patient.SABRA Marcey Penton, MD

## 2024-09-06 ENCOUNTER — Ambulatory Visit: Admitting: Urology

## 2025-03-14 ENCOUNTER — Ambulatory Visit: Admitting: Urology
